# Patient Record
Sex: Male | Born: 2000 | Race: White | Hispanic: No | Marital: Single | Smoking: Current some day smoker
Health system: Southern US, Community
[De-identification: ages and names within clinical notes are randomized; demographics above are authoritative.]

## PROBLEM LIST (undated history)

## (undated) DIAGNOSIS — F329 Major depressive disorder, single episode, unspecified: Secondary | ICD-10-CM

## (undated) DIAGNOSIS — F79 Unspecified intellectual disabilities: Secondary | ICD-10-CM

## (undated) DIAGNOSIS — F32A Depression, unspecified: Secondary | ICD-10-CM

## (undated) DIAGNOSIS — F909 Attention-deficit hyperactivity disorder, unspecified type: Secondary | ICD-10-CM

## (undated) DIAGNOSIS — F4310 Post-traumatic stress disorder, unspecified: Secondary | ICD-10-CM

## (undated) DIAGNOSIS — R51 Headache: Secondary | ICD-10-CM

## (undated) HISTORY — DX: Headache: R51

## (undated) HISTORY — PX: CIRCUMCISION: SHX1350

---
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
## 2019-01-20 ENCOUNTER — Other Ambulatory Visit: Payer: Self-pay

## 2019-01-20 DIAGNOSIS — Z20822 Contact with and (suspected) exposure to covid-19: Secondary | ICD-10-CM

## 2019-01-22 LAB — NOVEL CORONAVIRUS, NAA: SARS-CoV-2, NAA: NOT DETECTED

## 2019-01-23 ENCOUNTER — Telehealth: Payer: Self-pay | Admitting: General Practice

## 2019-01-23 NOTE — Telephone Encounter (Signed)
Negative COVID results given. Patient results "NOT Detected." Caller expressed understanding. ° °

## 2019-05-20 ENCOUNTER — Other Ambulatory Visit: Payer: Self-pay

## 2019-05-20 ENCOUNTER — Encounter: Payer: Self-pay | Admitting: Emergency Medicine

## 2019-05-20 ENCOUNTER — Emergency Department
Admission: EM | Admit: 2019-05-20 | Discharge: 2019-05-20 | Disposition: A | Discharge status: Home / Self Care | Payer: Medicaid Other | Attending: Emergency Medicine | Admitting: Emergency Medicine

## 2019-05-20 ENCOUNTER — Emergency Department: Payer: Medicaid Other

## 2019-05-20 DIAGNOSIS — Z87891 Personal history of nicotine dependence: Secondary | ICD-10-CM | POA: Insufficient documentation

## 2019-05-20 DIAGNOSIS — F909 Attention-deficit hyperactivity disorder, unspecified type: Secondary | ICD-10-CM | POA: Diagnosis not present

## 2019-05-20 DIAGNOSIS — Y939 Activity, unspecified: Secondary | ICD-10-CM | POA: Insufficient documentation

## 2019-05-20 DIAGNOSIS — S29012A Strain of muscle and tendon of back wall of thorax, initial encounter: Secondary | ICD-10-CM | POA: Insufficient documentation

## 2019-05-20 DIAGNOSIS — Z79899 Other long term (current) drug therapy: Secondary | ICD-10-CM | POA: Insufficient documentation

## 2019-05-20 DIAGNOSIS — Y92410 Unspecified street and highway as the place of occurrence of the external cause: Secondary | ICD-10-CM | POA: Insufficient documentation

## 2019-05-20 DIAGNOSIS — Y999 Unspecified external cause status: Secondary | ICD-10-CM | POA: Diagnosis not present

## 2019-05-20 DIAGNOSIS — S299XXA Unspecified injury of thorax, initial encounter: Secondary | ICD-10-CM | POA: Diagnosis present

## 2019-05-20 DIAGNOSIS — T148XXA Other injury of unspecified body region, initial encounter: Secondary | ICD-10-CM

## 2019-05-20 MED ORDER — BACLOFEN 5 MG PO TABS: 5.0000 mg | ORAL_TABLET | Freq: Three times a day (TID) | ORAL | 0 refills | Status: DC | PRN
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
## 2019-08-17 ENCOUNTER — Ambulatory Visit (INDEPENDENT_AMBULATORY_CARE_PROVIDER_SITE_OTHER): Payer: Medicaid Other

## 2019-08-17 ENCOUNTER — Other Ambulatory Visit: Payer: Self-pay

## 2019-08-17 ENCOUNTER — Ambulatory Visit
Admission: EM | Admit: 2019-08-17 | Discharge: 2019-08-17 | Disposition: A | Discharge status: Home / Self Care | Payer: Medicaid Other | Attending: Family Medicine | Admitting: Family Medicine

## 2019-08-17 DIAGNOSIS — M25572 Pain in left ankle and joints of left foot: Secondary | ICD-10-CM

## 2019-08-17 DIAGNOSIS — M25561 Pain in right knee: Secondary | ICD-10-CM

## 2019-08-17 MED ORDER — MELOXICAM 15 MG PO TABS: 15.0000 mg | ORAL_TABLET | Freq: Every day | ORAL | 0 refills | Status: DC | PRN

## 2019-08-17 NOTE — Discharge Instructions (Signed)
No evidence of fracture.  Take medication as prescribed.  Dr. Adriana Simas
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
## 2020-06-12 ENCOUNTER — Ambulatory Visit
Admission: EM | Admit: 2020-06-12 | Discharge: 2020-06-12 | Disposition: A | Discharge status: Home / Self Care | Payer: Medicaid Other

## 2020-06-12 ENCOUNTER — Other Ambulatory Visit: Payer: Self-pay

## 2020-06-12 ENCOUNTER — Encounter: Payer: Self-pay | Admitting: Emergency Medicine

## 2020-06-12 NOTE — ED Triage Notes (Signed)
Patient states that he woke up this morning and had redness and swelling on the right side of his face.  Patient denies itching.  Patient states that took 2 Benadryl tablets today and an allergy medicine.  Patient denies SOB or difficulty swallowing.

## 2020-06-12 NOTE — ED Notes (Signed)
Patient states that he can no longer wait to be seen and needs to leave.

## 2021-06-02 ENCOUNTER — Emergency Department (HOSPITAL_COMMUNITY)
Admission: EM | Admit: 2021-06-02 | Discharge: 2021-06-02 | Disposition: A | Discharge status: Home / Self Care | Payer: Medicaid Other | Attending: Emergency Medicine | Admitting: Emergency Medicine

## 2021-06-02 ENCOUNTER — Encounter (HOSPITAL_COMMUNITY): Payer: Self-pay | Admitting: Emergency Medicine

## 2021-06-02 DIAGNOSIS — T18120A Food in esophagus causing compression of trachea, initial encounter: Secondary | ICD-10-CM | POA: Insufficient documentation

## 2021-06-02 DIAGNOSIS — K222 Esophageal obstruction: Secondary | ICD-10-CM

## 2021-06-02 DIAGNOSIS — X58XXXA Exposure to other specified factors, initial encounter: Secondary | ICD-10-CM | POA: Insufficient documentation

## 2021-06-02 DIAGNOSIS — T189XXA Foreign body of alimentary tract, part unspecified, initial encounter: Secondary | ICD-10-CM | POA: Diagnosis present

## 2021-06-02 DIAGNOSIS — T18128A Food in esophagus causing other injury, initial encounter: Secondary | ICD-10-CM

## 2021-06-02 NOTE — ED Provider Notes (Signed)
?[facility] DEPT ?Provider Note ? ? ?CSN: [id number] ?Arrival date & time: 06/02/21  1812 ? ?  ? ?History ? ?Chief Complaint  ?Patient presents with  ? Swallowed Foreign Body  ? ? ?Ryan Chen is a 21 y.o. male. ? ?Patient is a 21 year old male who presents with difficulty swallowing.  He states he was eating a steak and chicken burrito about an hour prior to arrival and feels like it got stuck in his throat.  He says anytime he tries to swallow it comes right back up.  No shortness of breath.  No history of similar symptoms in the past. ? ? ?  ? ?Home Medications ?Prior to Admission medications   ?Medication Sig Start Date End Date Taking? Authorizing Provider  ?CONCERTA 36 MG CR tablet TK 1 T PO QAM 02/21/18   [provider]  ?escitalopram (LEXAPRO) 10 MG tablet TAKE 1/2 TABLET BY MOUTH DAILY FOR 1 WEEK THEN TAKE 1 TABLET BY MOUTH DAILY 07/20/19   [provider]  ?loratadine (CLARITIN) 10 MG tablet Take 10 mg by mouth daily.    [provider]  ?meloxicam (MOBIC) 15 MG tablet Take 1 tablet (15 mg total) by mouth daily as needed for pain. 08/17/19   Coral Spikes, DO  ?sertraline (ZOLOFT) 50 MG tablet Take 1 tablet by mouth daily. 03/07/17 08/17/19  [provider]  ?traZODone (DESYREL) 50 MG tablet TK 1 T PO QHS 02/21/18 08/17/19  [provider]  ?Alycia Patten 40 MG capsule Take one capsule by mouth every morning 01/07/16 08/17/19  [provider]  ?   ? ?Allergies    ?Soy allergy and Amoxicillin   ? ?Review of Systems   ?Review of Systems  ?Constitutional:  Negative for chills, diaphoresis, fatigue and fever.  ?HENT:  Positive for trouble swallowing. Negative for congestion, rhinorrhea and sneezing.   ?Eyes: Negative.   ?Respiratory:  Negative for cough, chest tightness and shortness of breath.   ?Cardiovascular:  Negative for chest pain and leg swelling.  ?Gastrointestinal:  Negative for abdominal pain, blood in stool, diarrhea,  nausea and vomiting.  ?Genitourinary:  Negative for difficulty urinating, flank pain, frequency and hematuria.  ?Musculoskeletal:  Negative for arthralgias and back pain.  ?Skin:  Negative for rash.  ?Neurological:  Negative for dizziness, speech difficulty, weakness, numbness and headaches.  ? ?Physical Exam ?Updated Vital Signs ?BP (!) 111/56   Pulse (!) 50   Temp 98.6 ?F (37 ?C) (Oral)   Resp 11   Ht 6\' 1"  (1.854 m)   Wt 113.4 kg   SpO2 98%   BMI 32.98 kg/m?  ?Physical Exam ?Constitutional:   ?   Appearance: He is well-developed.  ?HENT:  ?   Head: Normocephalic and atraumatic.  ?Eyes:  ?   Pupils: Pupils are equal, round, and reactive to light.  ?Cardiovascular:  ?   Rate and Rhythm: Normal rate and regular rhythm.  ?   Heart sounds: Normal heart sounds.  ?Pulmonary:  ?   Effort: Pulmonary effort is normal. No respiratory distress.  ?   Breath sounds: Normal breath sounds. No wheezing or rales.  ?Chest:  ?   Chest wall: No tenderness.  ?Abdominal:  ?   General: Bowel sounds are normal.  ?   Palpations: Abdomen is soft.  ?   Tenderness: There is no abdominal tenderness. There is no guarding or rebound.  ?Musculoskeletal:     ?   General: Normal range of motion.  ?  Cervical back: Normal range of motion and neck supple.  ?Lymphadenopathy:  ?   Cervical: No cervical adenopathy.  ?Skin: ?   General: Skin is warm and dry.  ?   Findings: No rash.  ?Neurological:  ?   Mental Status: He is alert and oriented to person, place, and time.  ? ? ?ED Results / Procedures / Treatments   ?Labs ?(all labs ordered are listed, but only abnormal results are displayed) ?Labs Reviewed - No data to display ? ?EKG ?None ? ?Radiology ?No results found. ? ?Procedures ?Procedures  ? ? ?Medications Ordered in ED ?Medications - No data to display ? ?ED Course/ Medical Decision Making/ A&P ?  ?                        ?Medical Decision Making ? ?Patient is a 21 year old male who presents with a possible esophageal foreign body.  He  said he was unable to swallow anything without it coming right back up.  After examining the patient, I gave him some water to sip and he actually felt like it went down.  He was able to swallow the water without difficulty.  He then had a bigger p.o. trial which he handled without difficulty.  He is able to eat and drink without difficulty.  He denies any foreign body sensation.  He was discharged home in good condition.  He has a follow-up appointment with his PCP on April 1.  I also gave him referral to gastroenterology if he has any ongoing symptoms.  Return precautions were given. ? ?Final Clinical Impression(s) / ED Diagnoses ?Final diagnoses:  ?Esophageal obstruction due to food impaction  ? ? ?Rx / DC Orders ?ED Discharge Orders   ? ? None  ? ?  ? ? ?  ?Malvin Johns, MD ?06/02/21 2059 ? ?

## 2021-06-02 NOTE — ED Triage Notes (Signed)
Patient reports eating burrito x1 hour ago and it is stuck in his throat. Unable to tolerate PO. ?

## 2021-06-02 NOTE — ED Notes (Signed)
Patient verbalizes understanding of discharge instructions. Opportunity for questioning and answers were provided. Armband removed by staff, pt discharged from ED. Ambulated out to lobby  

## 2021-06-02 NOTE — Discharge Instructions (Signed)
Eat soft food for the next few days and chew your food really well.  Follow-up with your primary care doctor or gastroenterologist listed above if you have any ongoing symptoms.  Return to emergency room if you have any worsening symptoms. ?

## 2021-12-07 ENCOUNTER — Ambulatory Visit: Payer: Self-pay

## 2022-06-11 ENCOUNTER — Other Ambulatory Visit: Payer: Self-pay

## 2022-06-11 ENCOUNTER — Emergency Department
Admission: EM | Admit: 2022-06-11 | Discharge: 2022-06-11 | Disposition: A | Discharge status: Home / Self Care | Payer: No Typology Code available for payment source | Attending: Emergency Medicine | Admitting: Emergency Medicine

## 2022-06-11 ENCOUNTER — Emergency Department: Payer: No Typology Code available for payment source

## 2022-06-11 DIAGNOSIS — F1193 Opioid use, unspecified with withdrawal: Secondary | ICD-10-CM | POA: Diagnosis not present

## 2022-06-11 DIAGNOSIS — F1490 Cocaine use, unspecified, uncomplicated: Secondary | ICD-10-CM | POA: Diagnosis not present

## 2022-06-11 DIAGNOSIS — F1210 Cannabis abuse, uncomplicated: Secondary | ICD-10-CM | POA: Diagnosis not present

## 2022-06-11 DIAGNOSIS — F1120 Opioid dependence, uncomplicated: Secondary | ICD-10-CM

## 2022-06-11 LAB — URINE DRUG SCREEN, QUALITATIVE (ARMC ONLY)
Amphetamines, Ur Screen: NOT DETECTED
Barbiturates, Ur Screen: NOT DETECTED
Benzodiazepine, Ur Scrn: NOT DETECTED
Cannabinoid 50 Ng, Ur ~~LOC~~: POSITIVE — AB
Cocaine Metabolite,Ur ~~LOC~~: POSITIVE — AB
MDMA (Ecstasy)Ur Screen: NOT DETECTED
Methadone Scn, Ur: NOT DETECTED
Opiate, Ur Screen: POSITIVE — AB
Phencyclidine (PCP) Ur S: NOT DETECTED
Tricyclic, Ur Screen: NOT DETECTED

## 2022-06-11 LAB — CBC
HCT: 44 % (ref 39.0–52.0)
Hemoglobin: 14.9 g/dL (ref 13.0–17.0)
MCH: 29.9 pg (ref 26.0–34.0)
MCHC: 33.9 g/dL (ref 30.0–36.0)
MCV: 88.4 fL (ref 80.0–100.0)
Platelets: 234 10*3/uL (ref 150–400)
RBC: 4.98 MIL/uL (ref 4.22–5.81)
RDW: 12.4 % (ref 11.5–15.5)
WBC: 10.4 10*3/uL (ref 4.0–10.5)
nRBC: 0 % (ref 0.0–0.2)

## 2022-06-11 LAB — COMPREHENSIVE METABOLIC PANEL
ALT: 21 U/L (ref 0–44)
AST: 22 U/L (ref 15–41)
Albumin: 4.6 g/dL (ref 3.5–5.0)
Alkaline Phosphatase: 57 U/L (ref 38–126)
Anion gap: 10 (ref 5–15)
BUN: 19 mg/dL (ref 6–20)
CO2: 22 mmol/L (ref 22–32)
Calcium: 9.8 mg/dL (ref 8.9–10.3)
Chloride: 108 mmol/L (ref 98–111)
Creatinine, Ser: 0.83 mg/dL (ref 0.61–1.24)
GFR, Estimated: >60 mL/min (ref >60)
Glucose, Bld: 111 mg/dL — ABNORMAL HIGH (ref 70–99)
Potassium: 3.7 mmol/L (ref 3.5–5.1)
Sodium: 140 mmol/L (ref 135–145)
Total Bilirubin: 0.8 mg/dL (ref 0.3–1.2)
Total Protein: 7.3 g/dL (ref 6.5–8.1)

## 2022-06-11 LAB — ETHANOL: Alcohol, Ethyl (B): <10 mg/dL (ref <10)

## 2022-06-11 MED ORDER — BUPRENORPHINE HCL-NALOXONE HCL 2-0.5 MG SL SUBL
2.0000 | SUBLINGUAL_TABLET | Freq: Once | SUBLINGUAL | Status: AC
  Administered 2022-06-11: 2 via SUBLINGUAL
  Filled 2022-06-11: qty 2

## 2022-06-11 MED ORDER — NALOXONE HCL 4 MG/0.1ML NA LIQD: NASAL | 0 refills | Status: AC

## 2022-06-11 NOTE — ED Triage Notes (Addendum)
Pt to ED via POV from home. Pt reports withdraw from heroin. Pt is trying to self-detox and is wanting to get on subaxone. Pt denies [place]. Pt reports mild nausea, generalized body aches and tremors. Pt states last used heroin was yesterday at 3pm.

## 2022-06-11 NOTE — ED Notes (Signed)
See triage note. Pt reports withdrawing from heroin, last use yesterday morning. Reports arms and legs hurting and some nausea. Pt repeatedly sitting up and down, appears restless.

## 2022-06-11 NOTE — Discharge Instructions (Addendum)
You are seen in the emergency department and you had a urine that was positive for cocaine, opioids and marijuana.  It is important that you get help with your addiction issues.  You had a CT scan of your head that was normal.  Do not believe that you are having a seizure disorder but believe that you are episodes are from substance abuse.

## 2022-06-11 NOTE — ED Notes (Signed)
Pharmacy contacted for suboxone

## 2022-06-11 NOTE — ED Provider Notes (Addendum)
[facility] Provider Note    Event Date/Time   First MD Initiated Contact with Patient 06/11/22 1453     (approximate)   History   Withdrawal   HPI  Ryan Chen is a 22 y.o. male past medical history significant for heroin use, who presents to the emergency department with concern for withdrawal.  Patient states that he has not been feeling well and withdrawing from heroin, last use heroin yesterday.  Feeling very restless, nauseous and sweating.  States that he has not been on Subutex a long time in the past approximately 2 years ago.  States that his mother is currently calling around to find a place for him for rehab and Suboxone.  Mother called nursing staff and stated that he has had multiple episodes where she was concerned he may have a seizure.  Did not have a postictal period.  States that he will lay on the ground, no generalized shaking activity, no urinary or tongue biting.  Immediately returned back to his normal.     Physical Exam   Triage Vital Signs: ED Triage Vitals  Enc Vitals Group     BP 06/11/22 1409 129/81     Pulse Rate 06/11/22 1409 (!) 103     Resp 06/11/22 1409 18     Temp 06/11/22 1416 98.1 F (36.7 C)     Temp Source 06/11/22 1409 Oral     SpO2 06/11/22 1409 95 %     Weight --      Height --      Head Circumference --      Peak Flow --      Pain Score 06/11/22 1413 8     Pain Loc --      Pain Edu? --      Excl. in [place]? --     Most recent vital signs: Vitals:   06/11/22 1409 06/11/22 1416  BP: 129/81   Pulse: (!) 103   Resp: 18   Temp:  98.1 F (36.7 C)  SpO2: 95%     Physical Exam Constitutional:      Appearance: He is well-developed.     Comments: Restless and trying to walk the hall by the stretcher  HENT:     Head: Atraumatic.  Eyes:     Conjunctiva/sclera: Conjunctivae normal.  Cardiovascular:     Rate and Rhythm: Regular rhythm.  Pulmonary:     Effort: No respiratory distress.   Musculoskeletal:     Cervical back: Normal range of motion.  Skin:    General: Skin is warm.  Neurological:     Mental Status: He is alert. Mental status is at baseline.      IMPRESSION / MDM / ASSESSMENT AND PLAN / ED COURSE  I reviewed the triage vital signs and the nursing notes.  Differential diagnosis including electrolyte abnormality, intracranial hemorrhage, heroin withdraw  Picture consistent with heroin withdrawal.  Will give a dose of Suboxone 4 mg in the emergency department.  Discussed with the patient that we will not do a prescription and that he will need to follow-up as an outpatient.  Given information for [facility] services and with psychiatry.  Given mother's questionable concern for seizure-like activity does not appear to sound like a seizure given he has no postictal period.  Will do a CT scan of his head to evaluate for intracranial hemorrhage.    Labs (all labs ordered are listed, but only abnormal results are displayed) Labs interpreted as -  Labs Reviewed  COMPREHENSIVE METABOLIC PANEL - Abnormal; Notable for the following components:      Result Value   Glucose, Bld 111 (*)    All other components within normal limits  URINE DRUG SCREEN, QUALITATIVE ([facility] ONLY) - Abnormal; Notable for the following components:   Cocaine Metabolite,Ur [place] POSITIVE (*)    Opiate, Ur Screen POSITIVE (*)    Cannabinoid 50 Ng, Ur [place] POSITIVE (*)    All other components within normal limits  ETHANOL  CBC   No significant electrolyte abnormalities.  CT scan of the head without signs of intracranial hemorrhage and EKG is reassuring will discharge home with prescription for Narcan.  Have a low suspicion for seizure disorder.  Clinical picture is does not sound like a dysrhythmia.  No syncopal episodes.  UDS positive for cocaine, opioids and marijuana.  Given a prescription for Narcan.  Given outpatient resources.      PROCEDURES:  Critical Care performed:  No  Procedures  Patient's presentation is most consistent with acute presentation with potential threat to life or bodily function.   MEDICATIONS ORDERED IN ED: Medications  buprenorphine-naloxone (SUBOXONE) 2-0.5 mg per SL tablet 2 tablet (has no administration in time range)    FINAL CLINICAL IMPRESSION(S) / ED DIAGNOSES   Final diagnoses:  Heroin use disorder, moderate  Heroin withdrawal  Cocaine use  Marijuana abuse     Rx / DC Orders   ED Discharge Orders          Ordered    naloxone (NARCAN) nasal spray 4 mg/0.1 mL        06/11/22 1511             Note:  This document was prepared using Dragon voice recognition software and may include unintentional dictation errors.   Nathaniel Man, MD 06/11/22 1510    Nathaniel Man, MD 06/11/22 [phone]

## 2022-06-11 NOTE — ED Notes (Signed)
Pt getting up walking around, stating "I can't get comfortable"

## 2022-08-02 ENCOUNTER — Emergency Department
Admission: EM | Admit: 2022-08-02 | Discharge: 2022-08-02 | Disposition: A | Discharge status: Home / Self Care | Payer: Medicaid Other | Attending: Emergency Medicine | Admitting: Emergency Medicine

## 2022-08-02 ENCOUNTER — Other Ambulatory Visit: Payer: Self-pay

## 2022-08-02 DIAGNOSIS — T402X1A Poisoning by other opioids, accidental (unintentional), initial encounter: Secondary | ICD-10-CM | POA: Insufficient documentation

## 2022-08-02 LAB — COMPREHENSIVE METABOLIC PANEL
ALT: 17 U/L (ref 0–44)
AST: 19 U/L (ref 15–41)
Albumin: 4 g/dL (ref 3.5–5.0)
Alkaline Phosphatase: 42 U/L (ref 38–126)
Anion gap: 10 (ref 5–15)
BUN: 25 mg/dL — ABNORMAL HIGH (ref 6–20)
CO2: 19 mmol/L — ABNORMAL LOW (ref 22–32)
Calcium: 8.6 mg/dL — ABNORMAL LOW (ref 8.9–10.3)
Chloride: 109 mmol/L (ref 98–111)
Creatinine, Ser: 0.94 mg/dL (ref 0.61–1.24)
GFR, Estimated: >60 mL/min (ref >60)
Glucose, Bld: 138 mg/dL — ABNORMAL HIGH (ref 70–99)
Potassium: 3.4 mmol/L — ABNORMAL LOW (ref 3.5–5.1)
Sodium: 138 mmol/L (ref 135–145)
Total Bilirubin: 0.5 mg/dL (ref 0.3–1.2)
Total Protein: 6.1 g/dL — ABNORMAL LOW (ref 6.5–8.1)

## 2022-08-02 LAB — CBC WITH DIFFERENTIAL/PLATELET
Abs Immature Granulocytes: 0.02 10*3/uL (ref 0.00–0.07)
Basophils Absolute: 0 10*3/uL (ref 0.0–0.1)
Basophils Relative: 1 %
Eosinophils Absolute: 1.2 10*3/uL — ABNORMAL HIGH (ref 0.0–0.5)
Eosinophils Relative: 14 %
HCT: 42.2 % (ref 39.0–52.0)
Hemoglobin: 14 g/dL (ref 13.0–17.0)
Immature Granulocytes: 0 %
Lymphocytes Relative: 38 %
Lymphs Abs: 3.3 10*3/uL (ref 0.7–4.0)
MCH: 30.1 pg (ref 26.0–34.0)
MCHC: 33.2 g/dL (ref 30.0–36.0)
MCV: 90.8 fL (ref 80.0–100.0)
Monocytes Absolute: 0.5 10*3/uL (ref 0.1–1.0)
Monocytes Relative: 6 %
Neutro Abs: 3.6 10*3/uL (ref 1.7–7.7)
Neutrophils Relative %: 41 %
Platelets: 255 10*3/uL (ref 150–400)
RBC: 4.65 MIL/uL (ref 4.22–5.81)
RDW: 13.6 % (ref 11.5–15.5)
WBC: 8.7 10*3/uL (ref 4.0–10.5)
nRBC: 0 % (ref 0.0–0.2)

## 2022-08-02 LAB — URINE DRUG SCREEN, QUALITATIVE (ARMC ONLY)
Amphetamines, Ur Screen: NOT DETECTED
Barbiturates, Ur Screen: NOT DETECTED
Benzodiazepine, Ur Scrn: NOT DETECTED
Cannabinoid 50 Ng, Ur ~~LOC~~: POSITIVE — AB
Cocaine Metabolite,Ur ~~LOC~~: POSITIVE — AB
MDMA (Ecstasy)Ur Screen: NOT DETECTED
Methadone Scn, Ur: POSITIVE — AB
Opiate, Ur Screen: POSITIVE — AB
Phencyclidine (PCP) Ur S: NOT DETECTED
Tricyclic, Ur Screen: POSITIVE — AB

## 2022-08-02 LAB — ETHANOL: Alcohol, Ethyl (B): <10 mg/dL (ref <10)

## 2022-08-02 LAB — SALICYLATE LEVEL: Salicylate Lvl: <7 mg/dL — ABNORMAL LOW (ref 7.0–30.0)

## 2022-08-02 LAB — ACETAMINOPHEN LEVEL: Acetaminophen (Tylenol), Serum: <10 ug/mL — ABNORMAL LOW (ref 10–30)

## 2022-08-02 MED ORDER — NALOXONE HCL 4 MG/0.1ML NA LIQD
1.0000 | Freq: Once | NASAL | Status: AC
  Administered 2022-08-02: 1 via NASAL
  Filled 2022-08-02: qty 4

## 2022-08-02 MED ORDER — LACTATED RINGERS IV BOLUS
1000.0000 mL | Freq: Once | INTRAVENOUS | Status: AC
  Administered 2022-08-02: 1000 mL via INTRAVENOUS

## 2022-08-02 NOTE — ED Provider Notes (Signed)
[facility] Provider Note    Event Date/Time   First MD Initiated Contact with Patient 08/02/22 0024     (approximate)   History   Drug Overdose   HPI  Ryan Chen is a 22 y.o. male past medical history of opiate use disorder currently on methadone, ADHD, PTSD, bipolar disorder who presents because of an accidental overdose.  Patient is currently on methadone 20 mg daily.  Has been in remission from his opiate use disorder since February.  Tonight was offered a half of oxycodone pill and took it.  Bystanders then called EMS because patient was altered.  When EMS arrived he was going in and out of consciousness and received 1 mg IV Narcan with improvement in mental status.  Patient also endorses taking 3 Benadryl pills he is not sure of the dose to try to sleep.  He denies any intent to harm himself says he has been out of his trazodone just wanted to sleep.  Does say that he would not typically take 3 pills at once.  Denies any other drug or alcohol use.  Denies any intent to harm himself no SI or depression.     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      Excl. in [place]?     Most recent vital signs: Vitals:   08/02/22 0130 08/02/22 0200  BP: 122/69 117/67  Pulse: 97 92  Resp: 15 17  Temp:    SpO2: 100% 98%     General: Awake, no distress.  CV:  Good peripheral perfusion tachycardic Resp:  Normal effort.  Abd:  No distention.  Neuro:             Awake, Alert, Oriented x 3  Other:  Calm cooperative, no SI   ED Results / Procedures / Treatments  Labs (all labs ordered are listed, but only abnormal results are displayed) Labs Reviewed  COMPREHENSIVE METABOLIC PANEL - Abnormal; Notable for the following components:      Result Value   Potassium 3.4 (*)    CO2 19 (*)    Glucose, Bld 138 (*)    BUN 25 (*)    Calcium 8.6 (*)    Total Protein 6.1 (*)    All other components within normal limits  CBC WITH DIFFERENTIAL/PLATELET -  Abnormal; Notable for the following components:   Eosinophils Absolute 1.2 (*)    All other components within normal limits  SALICYLATE LEVEL - Abnormal; Notable for the following components:   Salicylate Lvl <7.0 (*)    All other components within normal limits  ACETAMINOPHEN LEVEL - Abnormal; Notable for the following components:   Acetaminophen (Tylenol), Serum <10 (*)    All other components within normal limits  URINE DRUG SCREEN, QUALITATIVE ([facility] ONLY) - Abnormal; Notable for the following components:   Tricyclic, Ur Screen POSITIVE (*)    Cocaine Metabolite,Ur [place] POSITIVE (*)    Opiate, Ur Screen POSITIVE (*)    Cannabinoid 50 Ng, Ur [place] POSITIVE (*)    Methadone Scn, Ur POSITIVE (*)    All other components within normal limits  ETHANOL     EKG  EKG interpretation performed by myself: sinus tach, nml axis, nml intervals, no acute ischemic changes    RADIOLOGY    PROCEDURES:  Critical Care performed: No  .1-3 Lead EKG Interpretation  Performed by: Georga Hacking, MD Authorized by: Georga Hacking, MD      Interpretation: abnormal     ECG rate assessment: tachycardic     Rhythm: sinus tachycardia     Ectopy: none     Conduction: normal     The patient is on the cardiac monitor to evaluate for evidence of arrhythmia and/or significant heart rate changes.   MEDICATIONS ORDERED IN ED: Medications  naloxone (NARCAN) nasal spray 4 mg/0.1 mL (has no administration in time range)  lactated ringers bolus 1,000 mL (0 mLs Intravenous Stopped 08/02/22 0157)     IMPRESSION / MDM / ASSESSMENT AND PLAN / ED COURSE  I reviewed the triage vital signs and the nursing notes.                              Patient's presentation is most consistent with acute presentation with potential threat to life or bodily function.  Differential diagnosis includes, but is not limited to, accidental opiate overdose, anticholinergic overdose, opiate withdrawal  Patient is a 22 year old male with history of opiate use disorder currently on methadone who presents after an accidental opiate overdose.  He was given half a pill of oxycodone which she thinks was laced with fentanyl in hindsight.  He was found to be altered by family and when EMS arrived he was going in and out of consciousness received 1 mg of IV Narcan with improvement in mental status.  Does admit to taking 3 Benadryl pills to help him sleep this evening.  Denies any intent to harm himself denies any increasing depression but is out of his trazodone to use the Benadryl as replacement.  Patient is tachycardic on arrival but is saturating normal.  He is mentating normally with good respirations.  He has no complaints at this time.  He is not expressing any SI.  Plan to give a fluid bolus given his tachycardia.  He denies any symptoms of withdrawal currently but will monitor closely for this given he is on methadone chronically.  Will check Tylenol and salicylate level CBC CMP and ethanol given heavy Benadryl use tonight but this does not appear to have been a  suicide attempt do not feel need to IVC or involve psychiatry at this time.  Tylenol and salicylate on level are negative.  Patient's tachycardia improved.  UDS positive for multiple substances.  On reassessment he is  mentating normally with normal respirations and O2 sat.  Observed for about 2 and half hours in the ED.  This point I do think he is appropriate for discharge.  Will discharge with Narcan in hand.       FINAL CLINICAL IMPRESSION(S) / ED DIAGNOSES   Final diagnoses:  Opioid overdose, accidental or unintentional, initial encounter [facility])     Rx / DC Orders   ED Discharge Orders     None        Note:  This document was prepared using Dragon voice recognition software and may include unintentional dictation errors.   Georga Hacking, MD 08/02/22 [phone]

## 2022-08-02 NOTE — ED Notes (Signed)
Discharged with home narcan nasal spray. Pt verbalizes understanding and demonstrates proper administration.   Verbalizes discharge teaching. Has a ride home present.   Ambulatory out of department.

## 2022-08-02 NOTE — ED Notes (Signed)
Attempting to provide urine sample.

## 2022-08-02 NOTE — ED Triage Notes (Addendum)
Arrives [facility] from home after suspected drug overdose. Has hx of heroin use but sts last use was Feb. 2024. Sees [facility], and goes to methadone clinic daily.   Tonight took a suspected 0.5 tab of oxycodone but thought it looked strange also admits to taking (3) benadryl as a sleep aid. Reported afterwards pt was going "in-and-out" of it. Mother called EMS for transport to hospital. Denies intent to self  harm.   4mg  zofran 1mg  narcan admin by paramedics.  18g L-fa

## 2022-09-13 ENCOUNTER — Emergency Department
Admission: EM | Admit: 2022-09-13 | Discharge: 2022-09-17 | Disposition: A | Discharge status: Home / Self Care | Payer: MEDICAID | Attending: Student in an Organized Health Care Education/Training Program | Admitting: Student in an Organized Health Care Education/Training Program

## 2022-09-13 ENCOUNTER — Other Ambulatory Visit: Payer: Self-pay

## 2022-09-13 DIAGNOSIS — F79 Unspecified intellectual disabilities: Secondary | ICD-10-CM

## 2022-09-13 DIAGNOSIS — F1910 Other psychoactive substance abuse, uncomplicated: Secondary | ICD-10-CM | POA: Insufficient documentation

## 2022-09-13 DIAGNOSIS — R451 Restlessness and agitation: Secondary | ICD-10-CM

## 2022-09-13 DIAGNOSIS — F22 Delusional disorders: Secondary | ICD-10-CM | POA: Diagnosis not present

## 2022-09-13 DIAGNOSIS — F1914 Other psychoactive substance abuse with psychoactive substance-induced mood disorder: Secondary | ICD-10-CM | POA: Diagnosis not present

## 2022-09-13 DIAGNOSIS — Z79899 Other long term (current) drug therapy: Secondary | ICD-10-CM | POA: Insufficient documentation

## 2022-09-13 DIAGNOSIS — F1994 Other psychoactive substance use, unspecified with psychoactive substance-induced mood disorder: Secondary | ICD-10-CM | POA: Insufficient documentation

## 2022-09-13 DIAGNOSIS — F909 Attention-deficit hyperactivity disorder, unspecified type: Secondary | ICD-10-CM | POA: Diagnosis present

## 2022-09-13 LAB — CBC
HCT: 44.6 % (ref 39.0–52.0)
Hemoglobin: 14.8 g/dL (ref 13.0–17.0)
MCH: 29.7 pg (ref 26.0–34.0)
MCHC: 33.2 g/dL (ref 30.0–36.0)
MCV: 89.6 fL (ref 80.0–100.0)
Platelets: 233 10*3/uL (ref 150–400)
RBC: 4.98 MIL/uL (ref 4.22–5.81)
RDW: 13.2 % (ref 11.5–15.5)
WBC: 10.9 10*3/uL — ABNORMAL HIGH (ref 4.0–10.5)
nRBC: 0 % (ref 0.0–0.2)

## 2022-09-13 LAB — COMPREHENSIVE METABOLIC PANEL
ALT: 22 U/L (ref 0–44)
AST: 22 U/L (ref 15–41)
Albumin: 4.7 g/dL (ref 3.5–5.0)
Alkaline Phosphatase: 56 U/L (ref 38–126)
Anion gap: 9 (ref 5–15)
BUN: 22 mg/dL — ABNORMAL HIGH (ref 6–20)
CO2: 21 mmol/L — ABNORMAL LOW (ref 22–32)
Calcium: 9.3 mg/dL (ref 8.9–10.3)
Chloride: 107 mmol/L (ref 98–111)
Creatinine, Ser: 0.82 mg/dL (ref 0.61–1.24)
GFR, Estimated: >60 mL/min (ref >60)
Glucose, Bld: 108 mg/dL — ABNORMAL HIGH (ref 70–99)
Potassium: 3.3 mmol/L — ABNORMAL LOW (ref 3.5–5.1)
Sodium: 137 mmol/L (ref 135–145)
Total Bilirubin: 0.9 mg/dL (ref 0.3–1.2)
Total Protein: 7.4 g/dL (ref 6.5–8.1)

## 2022-09-13 LAB — URINE DRUG SCREEN, QUALITATIVE (ARMC ONLY)
Amphetamines, Ur Screen: NOT DETECTED
Barbiturates, Ur Screen: NOT DETECTED
Benzodiazepine, Ur Scrn: NOT DETECTED
Cannabinoid 50 Ng, Ur ~~LOC~~: POSITIVE — AB
Cocaine Metabolite,Ur ~~LOC~~: POSITIVE — AB
MDMA (Ecstasy)Ur Screen: NOT DETECTED
Methadone Scn, Ur: POSITIVE — AB
Opiate, Ur Screen: POSITIVE — AB
Phencyclidine (PCP) Ur S: NOT DETECTED
Tricyclic, Ur Screen: NOT DETECTED

## 2022-09-13 LAB — ACETAMINOPHEN LEVEL: Acetaminophen (Tylenol), Serum: <10 ug/mL — ABNORMAL LOW (ref 10–30)

## 2022-09-13 LAB — ETHANOL: Alcohol, Ethyl (B): <10 mg/dL (ref <10)

## 2022-09-13 LAB — SALICYLATE LEVEL: Salicylate Lvl: <7 mg/dL — ABNORMAL LOW (ref 7.0–30.0)

## 2022-09-13 NOTE — ED Notes (Signed)
Pt dressed out with this RN, [place] NT. Belongings include: Wallace Cullens shoes White socks [employer] Green shorts [employer] boxers

## 2022-09-13 NOTE — ED Notes (Signed)
Provided evening snack 

## 2022-09-13 NOTE — ED Provider Notes (Signed)
[facility] Provider Note    Event Date/Time   First MD Initiated Contact with Patient 09/13/22 1007     (approximate)   History   Psychiatric Evaluation   HPI  Tejuan Curler is a 22 y.o. male with a reported history of depression ADHD and impulsiveness presents to the ER due to increasing agitation and get an argument with his mother.  Reportedly missed his Abilify injection this past month.  Feeling increasingly paranoid that someone is going to hurt him because he says that he has money.  Denies SI or HI     Physical Exam   Triage Vital Signs: ED Triage Vitals  Enc Vitals Group     BP 09/13/22 0902 (!) 168/104     Pulse Rate 09/13/22 0902 (!) 125     Resp 09/13/22 0902 18     Temp 09/13/22 0902 98.8 F (37.1 C)     Temp src --      SpO2 09/13/22 0902 97 %     Weight 09/13/22 0903 250 lb (113.4 kg)     Height 09/13/22 0903 6' (1.829 m)     Head Circumference --      Peak Flow --      Pain Score 09/13/22 0902 0     Pain Loc --      Pain Edu? --      Excl. in [place]? --     Most recent vital signs: Vitals:   09/13/22 0902  BP: (!) 168/104  Pulse: (!) 125  Resp: 18  Temp: 98.8 F (37.1 C)  SpO2: 97%     Constitutional: Alert  Eyes: Conjunctivae are normal.  Head: Atraumatic. Nose: No congestion/rhinnorhea. Mouth/Throat: Mucous membranes are moist.   Neck: Painless ROM.  Cardiovascular:   Good peripheral circulation. Respiratory: Normal respiratory effort.  No retractions.  Gastrointestinal: Soft and nontender.  Musculoskeletal:  no deformity Neurologic:  MAE spontaneously. No gross focal neurologic deficits are appreciated.  Skin:  Skin is warm, dry and intact. No rash noted. Psychiatric: Calm and cooperative    ED Results / Procedures / Treatments   Labs (all labs ordered are listed, but only abnormal results are displayed) Labs Reviewed  COMPREHENSIVE METABOLIC PANEL - Abnormal; Notable for the following components:       Result Value   Potassium 3.3 (*)    CO2 21 (*)    Glucose, Bld 108 (*)    BUN 22 (*)    All other components within normal limits  CBC - Abnormal; Notable for the following components:   WBC 10.9 (*)    All other components within normal limits  ACETAMINOPHEN LEVEL - Abnormal; Notable for the following components:   Acetaminophen (Tylenol), Serum <10 (*)    All other components within normal limits  SALICYLATE LEVEL - Abnormal; Notable for the following components:   Salicylate Lvl <7.0 (*)    All other components within normal limits  ETHANOL  URINE DRUG SCREEN, QUALITATIVE ([facility] ONLY)     EKG     RADIOLOGY    PROCEDURES:  Critical Care performed:   Procedures   MEDICATIONS ORDERED IN ED: Medications - No data to display   IMPRESSION / MDM / ASSESSMENT AND PLAN / ED COURSE  I reviewed the triage vital signs and the nursing notes.  Differential diagnosis includes, but is not limited to, Psychosis, delirium, medication effect, noncompliance, polysubstance abuse, Si, Hi, depression  Patient here for evaluation of agititation.  Patient has psych history of depression, adhd, impulsiveness.  Laboratory testing was ordered to evaluation for underlying electrolyte derangement or signs of underlying organic pathology to explain today's presentation.  Based on history and physical and laboratory evaluation, it appears that the patient's presentation is 2/2 underlying psychiatric disorder and will require further evaluation and management by inpatient psychiatry.  Patient currently agrees to stay voluntary.        FINAL CLINICAL IMPRESSION(S) / ED DIAGNOSES   Final diagnoses:  Agitation     Rx / DC Orders   ED Discharge Orders     None        Note:  This document was prepared using Dragon voice recognition software and may include unintentional dictation errors.    Willy Eddy, MD 09/13/22 [phone]

## 2022-09-13 NOTE — BH Assessment (Signed)
Comprehensive Clinical Assessment (CCA) Note  09/13/2022 [facility] [id number]  Chief Complaint:  Chief Complaint  Patient presents with   Psychiatric Evaluation   Visit Diagnosis: Substance Induced Mood Disorder   Ryan Chen is a 22 year old male who presents to the ER, following an argument he had with his mother. Per the patient, he told his mother someone was going hurt him if he doesn't pay them back. He further explained, he borrowed some tools from them approximately a month ago and need to pay for them. That is what caused the argument.  Per the patient's mother ([phone]), the patient hasn't had his Abilify injection. When this happens, he becomes aggressive and impulsive, how he is currently. She states, he relapses on drugs and start to use again. Today, he told his mother he needed money to pay someone back, but was unable to get into his account. He already spent approximately a thousand dollars this week on drugs. After he was unable to get into his account, he started hitting things and yelling.  It led to him leaving the house and starting running up and down the street yelling. She further explains, he currently receives outpatient treatment with [facility] for his substance use. They have arranged for him to go to a residential treatment program but waiting on a bed to become available. He receives his Abilify injection through [facility], but hasn't followed up with his appointment. 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  During the interview, the patient was calm, cooperative and pleasant. He was able to provide appropriate answers to the questions. He denies SI/HI and AV/H.  CCA Screening, Triage and Referral (STR)  Patient Reported Information How did you hear about [place]? Family/Friend  What Is the Reason for Your Visit/Call Today? Patient state his mother wanted him to come to the ER, following an argument they had.  How Long Has This Been  Causing You Problems? 1 wk - 1 month  What Do You Feel Would Help You the Most Today? Alcohol or Drug Use Treatment   Have You Recently Had Any Thoughts About Hurting Yourself? No  Are You Planning to Commit Suicide/Harm Yourself At This time? No   Flowsheet Row ED from 09/13/2022 in [facility] Emergency Department at [facility] ED from 08/02/2022 in [facility] Emergency Department at [facility] ED from 06/11/2022 in [facility] Emergency Department at [facility]  C-SSRS RISK CATEGORY No Risk No Risk No Risk       Have you Recently Had Thoughts About Hurting Someone Karolee Ohs? No  Are You Planning to Harm Someone at This Time? No  Explanation: No data recorded  Have You Used Any Alcohol or Drugs in the Past 24 Hours? No  What Did You Use and How Much? Cocaine 09/12/2022   Do You Currently Have a Therapist/Psychiatrist? Yes  Name of Therapist/Psychiatrist: Name of Therapist/Psychiatrist: [facility] Ginette Otto)   Have You Been Recently Discharged From Any Public relations account executive or Programs? No  Explanation of Discharge From Practice/Program: No data recorded    CCA Screening Triage Referral Assessment Type of Contact: Face-to-Face  Telemedicine Service Delivery:   Is this Initial or Reassessment?   Date Telepsych consult ordered in [facility]:    Time Telepsych consult ordered in [facility]:    Location of Assessment: [facility] ED  Provider Location: [facility] ED   Collateral Involvement: No data recorded  Does Patient Have a Court Appointed Legal Guardian? No  Legal Guardian  Contact Information: No data recorded Copy of Legal Guardianship Form: No data recorded Legal Guardian Notified of Arrival: No data recorded Legal Guardian Notified of Pending Discharge: No data recorded If Minor and Not Living with Parent(s), Who has Custody? No data recorded Is CPS involved or ever been involved? Never  Is APS involved or ever been involved? Never   Patient Determined To Be At Risk for  Harm To Self or Others Based on Review of Patient Reported Information or Presenting Complaint? No  Method: No data recorded Availability of Means: No data recorded Intent: No data recorded Notification Required: No data recorded Additional Information for Danger to Others Potential: No data recorded Additional Comments for Danger to Others Potential: No data recorded Are There Guns or Other Weapons in Your Home? No data recorded Types of Guns/Weapons: No data recorded Are These Weapons Safely Secured?                            No data recorded Who Could Verify You Are Able To Have These Secured: No data recorded Do You Have any Outstanding Charges, Pending Court Dates, Parole/Probation? No data recorded Contacted To Inform of Risk of Harm To Self or Others: No data recorded   Does Patient Present under Involuntary Commitment? No    [place] of Residence: [place]   Patient Currently Receiving the Following Services: Group Home ([facility])   Determination of Need: Emergent (2 hours)   Options For Referral: ED Referral     CCA Biopsychosocial Patient Reported Schizophrenia/Schizoaffective Diagnosis in Past: No   Strengths: Some insight, stable housing and have a support system.   Mental Health Symptoms Depression:   Difficulty Concentrating; Irritability   Duration of Depressive symptoms:  Duration of Depressive Symptoms: Greater than two weeks   Mania:   Racing thoughts; Irritability   Anxiety:    Difficulty concentrating; Restlessness; Tension; Worrying   Psychosis:   None   Duration of Psychotic symptoms:    Trauma:   N/A   Obsessions:   N/A   Compulsions:   N/A   Inattention:   N/A   Hyperactivity/Impulsivity:   N/A   Oppositional/Defiant Behaviors:   N/A   Emotional Irregularity:   N/A   Other Mood/Personality Symptoms:  No data recorded   Mental Status Exam Appearance and self-care  Stature:   Average   Weight:    Average weight   Clothing:   Neat/clean; Age-appropriate   Grooming:   Normal   Cosmetic use:   None   Posture/gait:   Normal   Motor activity:   -- (within normal range)   Sensorium  Attention:   Normal   Concentration:   Normal   Orientation:   X5   Recall/memory:   Normal   Affect and Mood  Affect:   Anxious   Mood:   Anxious   Relating  Eye contact:   Fleeting   Facial expression:   Responsive; Anxious   Attitude toward examiner:   Cooperative   Thought and Language  Speech flow:  Clear and Coherent   Thought content:   Appropriate to Mood and Circumstances   Preoccupation:   None   Hallucinations:   None   Organization:   Coherent   [employer] of Knowledge:   Fair   Intelligence:   Average   Abstraction:   Functional   Judgement:   Fair   Dance movement psychotherapist:   Adequate  Insight:   Fair   Decision Making:   Impulsive   Social Functioning  Social Maturity:   Impulsive   Social Judgement:   Heedless   Stress  Stressors:   -- (Substance use)   Coping Ability:   Exhausted   Skill Deficits:   Interpersonal   Supports:   Family     Religion:    Leisure/Recreation:    Exercise/Diet: Exercise/Diet Do You Exercise?: No Have You Gained or Lost A Significant Amount of Weight in the Past Six Months?: No Do You Follow a Special Diet?: No Do You Have Any Trouble Sleeping?: No   CCA Employment/Education Employment/Work Situation: Employment / Work Systems developer: On disability Why is Patient on Disability: Mental Health How Long has Patient Been on Disability: Unable to quantify Patient's Job has Been Impacted by Current Illness: No  Education: Education Is Patient Currently Attending School?: No Did You Have An Individualized Education Program (IIEP): No Did You Have Any Difficulty At School?: No Patient's Education Has Been Impacted by Current Illness:  No   CCA Family/Childhood History Family and Relationship History: Family history Marital status: Single Does patient have children?: No  Childhood History:  Childhood History By whom was/is the patient raised?: Mother Did patient suffer any verbal/emotional/physical/sexual abuse as a child?: No Did patient suffer from severe childhood neglect?: No Has patient ever been sexually abused/assaulted/raped as an adolescent or adult?: No Was the patient ever a victim of a crime or a disaster?: No Witnessed domestic violence?: No Has patient been affected by domestic violence as an adult?: No  CCA Substance Use Alcohol/Drug Use: Alcohol / Drug Use Pain Medications: See PTA Prescriptions: See PTA Over the Counter: See PTA History of alcohol / drug use?: Yes Longest period of sobriety (when/how long): Unable to quantify Negative Consequences of Use: Personal relationships, Financial Substance #1 Name of Substance 1: Cocaine   ASAM's:  Six Dimensions of Multidimensional Assessment  Dimension 1:  Acute Intoxication and/or Withdrawal Potential:      Dimension 2:  Biomedical Conditions and Complications:      Dimension 3:  Emotional, Behavioral, or Cognitive Conditions and Complications:     Dimension 4:  Readiness to Change:     Dimension 5:  Relapse, Continued use, or Continued Problem Potential:     Dimension 6:  Recovery/Living Environment:     ASAM Severity Score:    ASAM Recommended Level of Treatment:     Substance use Disorder (SUD)    Recommendations for Services/Supports/Treatments:    Discharge Disposition:    DSM5 Diagnoses: Patient Active Problem List   Diagnosis Date Noted   Chronic pain of right knee 02/06/2016   Rash and nonspecific skin eruption 11/24/2015   Attention deficit hyperactivity disorder 11/23/2015   Impulsiveness 11/23/2015   Depression 11/22/2015   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

## 2022-09-13 NOTE — ED Triage Notes (Signed)
Pt to ED with family for psych eval. Reports has been off abilify for 1 month. Reports pt has been compulsive, walking up and down street saying somebody is going to kill him because he owes money, breaking things, hitting self.  Denies SI/HI.  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  Intellectual disability.  Last cocaine use 1 week ago. Denies ETOH.   Receiving treatment at [facility]

## 2022-09-13 NOTE — ED Notes (Signed)
VOL/  PENDING  CONSULT 

## 2022-09-14 MED ORDER — NICOTINE 14 MG/24HR TD PT24
14.0000 mg | MEDICATED_PATCH | Freq: Every day | TRANSDERMAL | Status: DC
  Administered 2022-09-14 – 2022-09-17 (×4): 14 mg via TRANSDERMAL
  Filled 2022-09-14 (×4): qty 1

## 2022-09-14 NOTE — BH Assessment (Signed)
TTS spoke with Psychiatric Nurse Practitioner Lerry Liner) about patient. He is recommended for inpatient treatment.

## 2022-09-14 NOTE — ED Notes (Signed)
Lunch tray given. 

## 2022-09-14 NOTE — ED Provider Notes (Signed)
Emergency Medicine Observation Re-evaluation Note  Ryan Chen is a 22 y.o. male, seen on rounds today.  Pt initially presented to the ED for complaints of Psychiatric Evaluation  Currently, the patient is resting comfortably.  Physical Exam  BP (!) 133/91 (BP Location: Left Arm)   Pulse 65   Temp 97.8 F (36.6 C) (Oral)   Resp 18   Ht 6' (1.829 m)   Wt 113.4 kg   SpO2 99%   BMI 33.91 kg/m  General: No acute distress Cardiac: Well-perfused extremities Lungs: No respiratory distress Psych: Appropriate mood and affect  ED Course / MDM  EKG:   I have reviewed the labs performed to date as well as medications administered while in observation.  Recent changes in the last 24 hours include none.  Plan  Current plan is for placement.   Merwyn Katos, MD 09/14/22 [phone]

## 2022-09-14 NOTE — ED Notes (Signed)
Mother called at this time to speak to pt- pt asleep. Pt's mother informed pt may call back later once he's awake- mother agreeable with plan.

## 2022-09-14 NOTE — ED Notes (Signed)
Attempted to get pts vitals. Pt snoring at this time. Pt resting comfortably.

## 2022-09-14 NOTE — BH Assessment (Signed)
Per [facility] (Kelly S.,), patient to be referred out of system.  Referral information for Psychiatric Hospitalization faxed to;   [facility] ([phone]-or- [phone]), no available bed  [facility] ([phone]-or- [phone]),   [facility] ([phone]),  [facility] ([phone]--- [phone]--- [phone]--- [phone])  [facility] ([phone]),   [facility] ([phone] -or- [phone]),   [facility] ([phone]),  [facility] ([phone])  [facility] ([phone]).  [facility] ([phone]) 

## 2022-09-14 NOTE — ED Notes (Signed)
Pt still sleeping comfortably. Breakfast tray set at bedside for pt.

## 2022-09-14 NOTE — ED Notes (Signed)
Pt given supplies for shower/hygiene and fresh scrubs/underwear to change into. Pt taking shower at this time.

## 2022-09-14 NOTE — ED Notes (Signed)
VOL PENDING  CONSULT  AND  PLACEMENT

## 2022-09-15 MED ORDER — ONDANSETRON 4 MG PO TBDP: 8.0000 mg | ORAL_TABLET | Freq: Three times a day (TID) | ORAL | Status: DC | PRN

## 2022-09-15 MED ORDER — IBUPROFEN 600 MG PO TABS
600.0000 mg | ORAL_TABLET | Freq: Four times a day (QID) | ORAL | Status: DC | PRN
  Administered 2022-09-15 – 2022-09-17 (×2): 600 mg via ORAL
  Filled 2022-09-15 (×3): qty 1

## 2022-09-15 MED ORDER — MELATONIN 5 MG PO TABS
2.5000 mg | ORAL_TABLET | Freq: Every evening | ORAL | Status: DC | PRN
  Administered 2022-09-15 – 2022-09-16 (×2): 2.5 mg via ORAL
  Filled 2022-09-15 (×2): qty 1

## 2022-09-15 NOTE — ED Provider Notes (Signed)
Emergency Medicine Observation Re-evaluation Note  Ryan Chen is a 22 y.o. male, seen on rounds today.  Pt initially presented to the ED for complaints of Psychiatric Evaluation  Currently, the patient is resting comfortably.  Physical Exam  BP (!) 148/83 (BP Location: Left Arm)   Pulse 62   Temp 98.6 F (37 C) (Oral)   Resp 16   Ht 6' (1.829 m)   Wt 113.4 kg   SpO2 98%   BMI 33.91 kg/m  General: No acute distress Lungs: No respiratory distress Psych: Calm, resting. ED Course / MDM  EKG:   I have reviewed the labs performed to date as well as medications administered while in observation.  Recent changes in the last 24 hours include none.  Plan   Psychiatry team currently planning to place for psychiatric admission    Sharyn Creamer, MD 09/15/22 [phone]

## 2022-09-15 NOTE — ED Notes (Signed)
VOL/pending consult/ inpatient placement

## 2022-09-15 NOTE — ED Notes (Signed)
Pt given snack. 

## 2022-09-15 NOTE — ED Notes (Signed)
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 Pt sitting in the day room.

## 2022-09-15 NOTE — ED Notes (Signed)
Patient is vol pending placement 

## 2022-09-15 NOTE — ED Notes (Signed)
PT has been sleeping 3am-6:43am  no bathroom breaks.

## 2022-09-15 NOTE — ED Notes (Signed)
Provided pt with snacks.

## 2022-09-16 MED ORDER — ARIPIPRAZOLE 15 MG PO TABS
15.0000 mg | ORAL_TABLET | Freq: Every day | ORAL | Status: DC
  Administered 2022-09-17: 15 mg via ORAL
  Filled 2022-09-16: qty 1

## 2022-09-16 MED ORDER — ESCITALOPRAM OXALATE 10 MG PO TABS
10.0000 mg | ORAL_TABLET | Freq: Every day | ORAL | Status: DC
  Administered 2022-09-17: 10 mg via ORAL
  Filled 2022-09-16: qty 1

## 2022-09-16 MED ORDER — LORATADINE 10 MG PO TABS
10.0000 mg | ORAL_TABLET | Freq: Every day | ORAL | Status: DC
  Administered 2022-09-16 – 2022-09-17 (×2): 10 mg via ORAL
  Filled 2022-09-16 (×2): qty 1

## 2022-09-16 NOTE — ED Notes (Signed)
Pt mother here to visit, no issues during visit. Mother leaving behind softbound book for pt. Mother escorted out by security.

## 2022-09-16 NOTE — ED Notes (Addendum)
Pt has been sleeping form 3am-6:32am. Pt awake to use bathroom at 6:33am

## 2022-09-16 NOTE — ED Notes (Signed)
Pt given breakfast tray

## 2022-09-16 NOTE — ED Notes (Signed)
Pt given lunch tray.

## 2022-09-16 NOTE — ED Notes (Signed)
Patient is vol pending toc placement 

## 2022-09-16 NOTE — ED Notes (Signed)
Pt asking for phone to call mom, provided with phone. Pt then comes to the door stating "she wants to speak with you." This RN spoke to pt's mom, giving update on placement and medication. No new meds at this time due to not having a psych physician on site the last couple days.

## 2022-09-16 NOTE — ED Notes (Signed)
Pt given dinner tray.

## 2022-09-16 NOTE — ED Provider Notes (Signed)
Emergency Medicine Observation Re-evaluation Note  Ryan Chen is a 22 y.o. male, seen on rounds today.  Pt initially presented to the ED for complaints of Psychiatric Evaluation Currently, the patient is awaiting placement.  Physical Exam  BP (!) 157/95   Pulse 92   Temp 98.2 F (36.8 C)   Resp 15   Ht 6' (1.829 m)   Wt 113.4 kg   SpO2 98%   BMI 33.91 kg/m  Physical Exam General: no acute distress  ED Course / MDM  No labs in past 24 hours  Plan  Current plan is for placement.    Phineas Semen, MD 09/16/22 [id number]

## 2022-09-17 DIAGNOSIS — F1994 Other psychoactive substance use, unspecified with psychoactive substance-induced mood disorder: Secondary | ICD-10-CM | POA: Insufficient documentation

## 2022-09-17 DIAGNOSIS — F1910 Other psychoactive substance abuse, uncomplicated: Secondary | ICD-10-CM | POA: Insufficient documentation

## 2022-09-17 DIAGNOSIS — F79 Unspecified intellectual disabilities: Secondary | ICD-10-CM

## 2022-09-17 MED ORDER — METHADONE HCL 10 MG PO TABS
30.0000 mg | ORAL_TABLET | Freq: Every day | ORAL | Status: DC
  Administered 2022-09-17: 30 mg via ORAL
  Filled 2022-09-17: qty 3

## 2022-09-17 MED ORDER — ESCITALOPRAM OXALATE 10 MG PO TABS: 10.0000 mg | ORAL_TABLET | Freq: Every day | ORAL | 2 refills | Status: DC

## 2022-09-17 MED ORDER — ARIPIPRAZOLE 15 MG PO TABS: 15.0000 mg | ORAL_TABLET | Freq: Every day | ORAL | 2 refills | Status: DC

## 2022-09-17 NOTE — ED Notes (Signed)
Patient received lunch tray and beverage. 

## 2022-09-17 NOTE — Consult Note (Signed)
[facility] Face-to-Face Psychiatry Consult   Reason for Consult:  Psychiatric Evaluation Referring Physician:  Willy Eddy, MD Patient Identification: Ryan Chen MRN:  [id number] Principal Diagnosis: Substance induced mood disorder (HCC) Diagnosis:  Principal Problem:   Substance induced mood disorder (HCC) Active Problems:   Attention deficit hyperactivity disorder   Polysubstance abuse (HCC)   Intellectual disability   Total Time spent with patient: 30 minutes  Subjective:  "I had an argument with my mom."    HPI:  Patient seen face to face by this provider, consulted with emergency department physician Dr. Larinda Buttery; and chart reviewed on 09/17/22. Ryan Chen, 22 y.o., male with past psychiatric history ADHD, IDD, depression, and PTSD presented voluntarily to [facility] ED due to agitation.    On evaluation Ryan Chen reports worsening anxiety over the past 2 months, characterized by increased tearfulness, difficulty focusing, and decreased appetite, leading to significant weight loss. He is unable to identify specific stressors that may have contributed to her increased anxiety.    Patient denies history of suicidal self-injurious behavior, past suicide attempt or psychiatric hospitalization. He is currently connected with outpatient psychiatry services through [facility] in [place], he is unable to recall his provider's name at this time. He states that he is compliant with medications. He is prescribed Abilify. Patient resides in [place] with his adoptive mother. He identifies his support system as his adoptive parents. He denies access to firearms. Patient acknowledges illicit substance use, cocaine and heroin, last used one week prior to admission. He denies alcohol use. BAL unremarkable. UDS + Cocaine, Cannjbinoid, Opiate, and Methadone. PDMP Review revealed 0 active prescription.   During evaluation Ryan Chen is seated on side of the bed  in no acute distress. He  is alert, oriented x 4, pleasant, calm, and cooperative. Speech is clear and coherent. Objectively there is no evidence of psychosis/mania or delusional thinking. Patient is able to converse coherently, goal directed thoughts, no distractibility, or pre-occupation. He also denies suicidal/self-harm/homicidal ideation, psychosis, and paranoia. Patient answered questions appropriately.   Past Psychiatric History: Charted history of ADHD, Depression, Intellectual disability, PTSD (post-traumatic stress disorder)  Risk to Self:   Risk to Others:   Prior Inpatient Therapy:   Prior Outpatient Therapy:    Past Medical History:  Past Medical History:  Diagnosis Date   ADHD (attention deficit hyperactivity disorder)    Depression    Headache(784.0)    Intellectual disability    PTSD (post-traumatic stress disorder)     Past Surgical History:  Procedure Laterality Date   CIRCUMCISION     TONSILLECTOMY Bilateral 2010   Family History:  Family History  Adopted: Yes  Problem Relation Age of Onset   Other Other        Fhx Heart Problems on Bio Father's Side   Family Psychiatric  History: None reported  Social History:  Social History   Substance and Sexual Activity  Alcohol Use Not Currently     Social History   Substance and Sexual Activity  Drug Use Yes   Types: Cocaine    Social History   Socioeconomic History   Marital status: Single    Spouse name: Not on file   Number of children: Not on file   Years of education: Not on file   Highest education level: Not on file  Occupational History   Not on file  Tobacco Use   Smoking status: Former    Types: Cigarettes   Smokeless tobacco: Never  Vaping Use  Vaping Use: Every day  Substance and Sexual Activity   Alcohol use: Not Currently   Drug use: Yes    Types: Cocaine   Sexual activity: Never  Other Topics Concern   Not on file  Social History Narrative   Not on file   Social Determinants of Health   Financial  Resource Strain: Not on file  Food Insecurity: Not on file  Transportation Needs: Not on file  Physical Activity: Not on file  Stress: Not on file  Social Connections: Not on file   Additional Social History:    Allergies:   Allergies  Allergen Reactions   Soy Allergy Swelling   Amoxicillin Nausea And Vomiting    Ineffective .Marland KitchenHas patient had a PCN reaction causing immediate rash, facial/tongue/throat swelling, SOB or lightheadedness with hypotension: No Has patient had a PCN reaction causing severe rash involving mucus membranes or skin necrosis: No Has patient had a PCN reaction that required hospitalization No Has patient had a PCN reaction occurring within the last 10 years: Yes If all of the above answers are "NO", then may proceed with Cephalosporin use.     Labs: No results found for this or any previous visit (from the past 48 hour(s)).  Current Facility-Administered Medications  Medication Dose Route Frequency Provider Last Rate Last Admin   ARIPiprazole (ABILIFY) tablet 15 mg  15 mg Oral Daily Dixon, Rashaun M, NP   15 mg at 09/17/22 0924   escitalopram (LEXAPRO) tablet 10 mg  10 mg Oral Daily Dixon, Rashaun M, NP   10 mg at 09/17/22 0924   ibuprofen (ADVIL) tablet 600 mg  600 mg Oral Q6H PRN Merwyn Katos, MD   600 mg at 09/17/22 0959   loratadine (CLARITIN) tablet 10 mg  10 mg Oral Daily Willy Eddy, MD   10 mg at 09/17/22 1610   melatonin tablet 2.5 mg  2.5 mg Oral QHS PRN Merwyn Katos, MD   2.5 mg at 09/16/22 2120   methadone (DOLOPHINE) tablet 30 mg  30 mg Oral Daily Chesley Noon, MD   30 mg at 09/17/22 1035   nicotine (NICODERM CQ - dosed in mg/24 hours) patch 14 mg  14 mg Transdermal Daily Willy Eddy, MD   14 mg at 09/17/22 0924   ondansetron (ZOFRAN-ODT) disintegrating tablet 8 mg  8 mg Oral Q8H PRN Merwyn Katos, MD       Current Outpatient Medications  Medication Sig Dispense Refill   ARIPiprazole (ABILIFY) 15 MG tablet Take 1 tablet  (15 mg total) by mouth daily. 30 tablet 2   escitalopram (LEXAPRO) 10 MG tablet Take 1 tablet (10 mg total) by mouth daily. 30 tablet 2   loratadine (CLARITIN) 10 MG tablet Take 10 mg by mouth daily.     naloxone (NARCAN) nasal spray 4 mg/0.1 mL sign 2 each 0    Musculoskeletal: Strength & Muscle Tone: within normal limits Gait & Station: normal Patient leans: N/A            Psychiatric Specialty Exam:  Presentation  General Appearance: Appropriate for Environment  Eye Contact:Good  Speech:Clear and Coherent  Speech Volume:Normal  Handedness:Right   Mood and Affect  Mood:-- (Calma nd cooperative)  Affect:Appropriate; Congruent   Thought Process  Thought Processes:Coherent; Linear  Descriptions of Associations:Intact  Orientation:Full (Time, Place and Person)  Thought Content:Logical; WDL  History of Schizophrenia/Schizoaffective disorder:No  Duration of Psychotic Symptoms:No data recorded Hallucinations:Hallucinations: None  Ideas of Reference:None  Suicidal Thoughts:Suicidal Thoughts: No  Homicidal Thoughts:Homicidal Thoughts: No   Sensorium  Memory:Immediate Good; Recent Good  Judgment:Fair  Insight:Fair   Executive Functions  Concentration:Good  Attention Span:Good  Recall:Good  Fund of Knowledge:Good  Language:Good   Psychomotor Activity  Psychomotor Activity:Psychomotor Activity: Normal   Assets  Assets:Desire for Improvement; Housing; Physical Health; Social Support   Sleep  Sleep:Sleep: Good   Physical Exam: Physical Exam Vitals and nursing note reviewed.  HENT:     Head: Normocephalic.     Nose: Nose normal.  Cardiovascular:     Comments: DBP Elevated: 118/91  Pulmonary:     Effort: Pulmonary effort is normal.  Musculoskeletal:        General: Normal range of motion.     Cervical back: Normal range of motion.  Neurological:     Mental Status: He is alert and oriented to person, place, and time.      Comments: Patient has charted history of Intellectual disability   Psychiatric:        Attention and Perception: Attention normal. He does not perceive auditory or visual hallucinations.        Mood and Affect: Mood and affect normal.        Speech: Speech normal.        Behavior: Behavior is cooperative.        Thought Content: Thought content normal. Thought content is not paranoid or delusional. Thought content does not include homicidal or suicidal ideation.        Cognition and Memory: Cognition and memory normal.        Judgment: Judgment normal.    ROS Blood pressure (!) 118/91, pulse 97, temperature 98.5 F (36.9 C), temperature source Oral, resp. rate 18, height 6' (1.829 m), weight 113.4 kg, SpO2 99 %. Body mass index is 33.91 kg/m.  Treatment Plan Summary: Plan : 22 y.o. male presented to the ED   Disposition:  No evidence of imminent risk to self or others at present.   Patient does not meet criteria for psychiatric inpatient admission. Supportive therapy provided about ongoing stressors. Refer to IOP. Discussed crisis plan, support from social network, calling 911, coming to the Emergency Department, and calling Suicide Hotline.  Norma Fredrickson, NP 09/17/2022 5:30 PM

## 2022-09-17 NOTE — BH Assessment (Signed)
Writer left message for patient's mom to return call.  Writer received call from mom informing that patient will be discharged and given resources for treatment. Mom says she is able to come pick up patient at 4:00pm. Mom inquired about patient receiving Abilify shot which works best as patient forgets to take pills. Writer provided mom with number for patient's nurse.

## 2022-09-17 NOTE — ED Notes (Signed)
MD did received call clarifying His dos of methadone that He has been taking and it was reordered.

## 2022-09-17 NOTE — ED Notes (Signed)
Breakfast tray provided. 

## 2022-09-17 NOTE — ED Notes (Signed)
Patient is calm and cooperative, takes all medications, and also took shower this am, His Mom called and states that He is on a waiting list for rehab, and she does not know when He will have a bed, but she states that if He comes home she knows that He will just go back to the drugs and that they adopted him and His whole biological family is on drugs, she states that His IQ has always been low , but since the drugs it is much worse> Mom also states that He needs His methadone back started because if He is off of it too long that He want be able to go to the rehab facility. Patient has been cooperative, no behavioral issues at this time, staff will continue to monitor.

## 2022-09-17 NOTE — ED Notes (Addendum)
Shower supplies provided and discarded appropriately

## 2022-09-17 NOTE — ED Notes (Signed)
Patient with discharge information, His Mom states that she would come around 4 pm, Nurse called and let her know that He would be moving to the quad to sit and wait for her and she states that she is trying to come earlier, she is very supportive, Patient is getting dressed at this time and will be escorted to the quad .

## 2022-09-17 NOTE — ED Provider Notes (Addendum)
Emergency Medicine Observation Re-evaluation Note  Ryan Chen is a 22 y.o. male, seen on rounds today.  Pt initially presented to the ED for complaints of Psychiatric Evaluation Currently, the patient is sleeping comfortably, no issues overnight per staff.  Physical Exam  BP 135/83 (BP Location: Right Arm)   Pulse 60   Temp 98.5 F (36.9 C) (Oral)   Resp 18   Ht 6' (1.829 m)   Wt 113.4 kg   SpO2 100%   BMI 33.91 kg/m  Physical Exam Patient appears well, no acute distress, normal WOB    ED Course / MDM  EKG:   I have reviewed the labs performed to date as well as medications administered while in observation.  Recent changes in the last 24 hours include none.  Plan  Current plan is for discharge home, patient cleared by psychiatry    Chesley Noon, MD 09/17/22 4098    Chesley Noon, MD 09/17/22 1322

## 2022-09-17 NOTE — ED Notes (Signed)
NP talked to the patient and He remained calm and cooperative.

## 2022-09-17 NOTE — ED Notes (Signed)
Vol/pending consult/inpatient placement.

## 2022-10-16 ENCOUNTER — Observation Stay
Admission: EM | Admit: 2022-10-16 | Discharge: 2022-10-18 | Disposition: A | Discharge status: Home / Self Care | Payer: MEDICAID | Attending: Internal Medicine | Admitting: Internal Medicine

## 2022-10-16 ENCOUNTER — Emergency Department: Payer: MEDICAID

## 2022-10-16 DIAGNOSIS — G9341 Metabolic encephalopathy: Secondary | ICD-10-CM

## 2022-10-16 DIAGNOSIS — T50904A Poisoning by unspecified drugs, medicaments and biological substances, undetermined, initial encounter: Principal | ICD-10-CM

## 2022-10-16 DIAGNOSIS — T43291A Poisoning by other antidepressants, accidental (unintentional), initial encounter: Principal | ICD-10-CM | POA: Insufficient documentation

## 2022-10-16 DIAGNOSIS — F314 Bipolar disorder, current episode depressed, severe, without psychotic features: Secondary | ICD-10-CM | POA: Diagnosis present

## 2022-10-16 DIAGNOSIS — F79 Unspecified intellectual disabilities: Secondary | ICD-10-CM

## 2022-10-16 DIAGNOSIS — E669 Obesity, unspecified: Secondary | ICD-10-CM | POA: Insufficient documentation

## 2022-10-16 DIAGNOSIS — F1910 Other psychoactive substance abuse, uncomplicated: Secondary | ICD-10-CM | POA: Diagnosis present

## 2022-10-16 DIAGNOSIS — Z87891 Personal history of nicotine dependence: Secondary | ICD-10-CM | POA: Diagnosis not present

## 2022-10-16 DIAGNOSIS — R7989 Other specified abnormal findings of blood chemistry: Secondary | ICD-10-CM | POA: Diagnosis not present

## 2022-10-16 DIAGNOSIS — T50901A Poisoning by unspecified drugs, medicaments and biological substances, accidental (unintentional), initial encounter: Secondary | ICD-10-CM | POA: Diagnosis present

## 2022-10-16 LAB — CBC
HCT: 43 % (ref 39.0–52.0)
Hemoglobin: 14.1 g/dL (ref 13.0–17.0)
MCH: 30.1 pg (ref 26.0–34.0)
MCHC: 32.8 g/dL (ref 30.0–36.0)
MCV: 91.7 fL (ref 80.0–100.0)
Platelets: 158 10*3/uL (ref 150–400)
RBC: 4.69 MIL/uL (ref 4.22–5.81)
RDW: 13.2 % (ref 11.5–15.5)
WBC: 7.5 10*3/uL (ref 4.0–10.5)
nRBC: 0 % (ref 0.0–0.2)

## 2022-10-16 LAB — CBG MONITORING, ED: Glucose-Capillary: 107 mg/dL — ABNORMAL HIGH (ref 70–99)

## 2022-10-16 MED ORDER — SODIUM CHLORIDE 0.9 % IV BOLUS
1000.0000 mL | Freq: Once | INTRAVENOUS | Status: AC
  Administered 2022-10-17: 1000 mL via INTRAVENOUS

## 2022-10-16 NOTE — ED Triage Notes (Signed)
Pt arrived via [facility] from home with reports of drug overdose. Pt admits to taking about 350mg  Trazodone and unknown MG of oxycodone, pt reported he was given 1 blue pill that was supposed to be oxycodone, pt states he was "trying to go to sleep" denies any SI/HI.  Pt is alert, frequently drowsy.

## 2022-10-16 NOTE — ED Provider Notes (Signed)
[facility] Provider Note    Event Date/Time   First MD Initiated Contact with Patient 10/16/22 2317     (approximate)   History   Overdose   HPI  Level V caveat: Limited by somnolence  Ryan Chen is a 22 y.o. male brought to the ED via EMS from home with a chief complaint of overdose.  Patient with a history of ADHD, PTSD, intellectual disability who admits to taking about 350 mg Trazodone, and unknown amount of oxycodone as well as Abilify.  Recent discharge from rehab.  States he was "trying to go to sleep".  Denies active SI/HI/AH/VH.  Voices no medical complaints.     Past Medical History   Past Medical History:  Diagnosis Date   ADHD (attention deficit hyperactivity disorder)    Depression    Headache(784.0)    Intellectual disability    PTSD (post-traumatic stress disorder)      Active Problem List   Patient Active Problem List   Diagnosis Date Noted   Substance induced mood disorder (HCC) 09/17/2022   Polysubstance abuse (HCC) 09/17/2022   Intellectual disability 09/17/2022   Chronic pain of right knee 02/06/2016   Rash and nonspecific skin eruption 11/24/2015   Attention deficit hyperactivity disorder 11/23/2015   Impulsiveness 11/23/2015   Depression 11/22/2015   Adjustment disorder of adolescence 11/21/2015   Migraine without aura and without status migrainosus, not intractable 07/31/2012   Anxiety state, unspecified 07/31/2012   Generalized anxiety disorder 07/31/2012     Past Surgical History   Past Surgical History:  Procedure Laterality Date   CIRCUMCISION     TONSILLECTOMY Bilateral 2010     Home Medications   Prior to Admission medications   Medication Sig Start Date End Date Taking? Authorizing Provider  ARIPiprazole (ABILIFY) 15 MG tablet Take 1 tablet (15 mg total) by mouth daily. 09/17/22 12/16/22  Chesley Noon, MD  escitalopram (LEXAPRO) 10 MG tablet Take 1 tablet (10 mg total) by mouth daily.  09/17/22 09/17/23  Chesley Noon, MD  loratadine (CLARITIN) 10 MG tablet Take 10 mg by mouth daily.    [provider]  naloxone Jonelle Sports) nasal spray 4 mg/0.1 mL sign 06/11/22   Corena Herter, MD  sertraline (ZOLOFT) 50 MG tablet Take 1 tablet by mouth daily. 03/07/17 08/17/19  [provider]  traZODone (DESYREL) 50 MG tablet TK 1 T PO QHS 02/21/18 08/17/19  [provider]  VYVANSE 40 MG capsule Take one capsule by mouth every morning 01/07/16 08/17/19  [provider]     Allergies  Soy allergy and Amoxicillin   Family History   Family History  Adopted: Yes  Problem Relation Age of Onset   Other Other        Fhx Heart Problems on Bio Father's Side     Physical Exam  Triage Vital Signs: ED Triage Vitals  Encounter Vitals Group     BP      Systolic BP Percentile      Diastolic BP Percentile      Pulse      Resp      Temp      Temp src      SpO2      Weight      Height      Head Circumference      Peak Flow      Pain Score      Pain Loc      Pain Education  Exclude from Growth Chart     Updated Vital Signs: BP 131/79   Pulse (!) 124   Temp 98.9 F (37.2 C) (Oral)   Resp 18   Ht 6' (1.829 m)   Wt 128.2 kg   SpO2 99%   BMI 38.33 kg/m    General: Drowsy but arousable to voice, mild distress.  CV:  Tachycardic.  Good peripheral perfusion.  Resp:  Normal effort.  CTAB. Abd:  Nontender.  No distention.  Other:  Awakens to voice.  Alert and oriented to person and place.  Pinpoint pupils.  CN II-XII grossly intact.  5/5 motor strength and sensation all extremities.  M AE x 4.   ED Results / Procedures / Treatments  Labs (all labs ordered are listed, but only abnormal results are displayed) Labs Reviewed  CBG MONITORING, ED - Abnormal; Notable for the following components:      Result Value   Glucose-Capillary 107 (*)    All other components within normal limits  CBC  COMPREHENSIVE METABOLIC PANEL  ACETAMINOPHEN LEVEL   ETHANOL  LIPASE, BLOOD  SALICYLATE LEVEL  URINE DRUG SCREEN, QUALITATIVE ([facility] ONLY)  TROPONIN I (HIGH SENSITIVITY)     EKG  ED ECG REPORT I, Mukesh Kornegay J, the attending physician, personally viewed and interpreted this ECG.   Date: 10/16/2022  EKG Time: 2322  Rate: 131  Rhythm: sinus tachycardia  Axis: Normal  Intervals:none  ST&T Change: Nonspecific    RADIOLOGY I have independently visualized and interpreted patient CT and chest x-ray as well as noted the radiology interpretation:  CT head:  Chest x-ray:  Official radiology report(s): No results found.   PROCEDURES:  Critical Care performed: Yes, see critical care procedure note(s)  CRITICAL CARE Performed by: Irean Hong   Total critical care time: *** minutes  Critical care time was exclusive of separately billable procedures and treating other patients.  Critical care was necessary to treat or prevent imminent or life-threatening deterioration.  Critical care was time spent personally by me on the following activities: development of treatment plan with patient and/or surrogate as well as nursing, discussions with consultants, evaluation of patient's response to treatment, examination of patient, obtaining history from patient or surrogate, ordering and performing treatments and interventions, ordering and review of laboratory studies, ordering and review of radiographic studies, pulse oximetry and re-evaluation of patient's condition.   Marland Kitchen1-3 Lead EKG Interpretation  Performed by: Irean Hong, MD Authorized by: Irean Hong, MD     Interpretation: abnormal     ECG rate:  130   ECG rate assessment: tachycardic     Rhythm: sinus tachycardia     Ectopy: none     Conduction: normal   Comments:     Patient placed on cardiac monitor to evaluate for arrhythmias    MEDICATIONS ORDERED IN ED: Medications  sodium chloride 0.9 % bolus 1,000 mL (has no administration in time range)     IMPRESSION /  MDM / ASSESSMENT AND PLAN / ED COURSE  I reviewed the triage vital signs and the nursing notes.                             22 year old male presenting to the ED status post overdose. Differential diagnosis includes, but is not limited to, alcohol, illicit or prescription medications, or other toxic ingestion; intracranial pathology such as stroke or intracerebral hemorrhage; fever or infectious causes including sepsis; hypoxemia and/or hypercarbia; uremia; trauma;  endocrine related disorders such as diabetes, hypoglycemia, and thyroid-related diseases; hypertensive encephalopathy; etc. I have personally reviewed patient's records and note a recent ED visit for agitation on 09/13/2022.  Prior to that he had 2 ED visits in April and May for heroin use and opioid overdose.  Patient's presentation is most consistent with acute presentation with potential threat to life or bodily function.  The patient is on the cardiac monitor to evaluate for evidence of arrhythmia and/or significant heart rate changes.  Will obtain toxicological lab work, CT head, chest x-ray.  Initiate IV fluid resuscitation.  Place patient under IVC for his safety.  Will consult poison control.      FINAL CLINICAL IMPRESSION(S) / ED DIAGNOSES   Final diagnoses:  Overdose of undetermined intent, initial encounter     Rx / DC Orders   ED Discharge Orders     None        Note:  This document was prepared using Dragon voice recognition software and may include unintentional dictation errors.

## 2022-10-17 ENCOUNTER — Encounter: Payer: Self-pay | Admitting: Emergency Medicine

## 2022-10-17 ENCOUNTER — Other Ambulatory Visit: Payer: Self-pay

## 2022-10-17 DIAGNOSIS — R7989 Other specified abnormal findings of blood chemistry: Secondary | ICD-10-CM

## 2022-10-17 DIAGNOSIS — T50901A Poisoning by unspecified drugs, medicaments and biological substances, accidental (unintentional), initial encounter: Secondary | ICD-10-CM | POA: Diagnosis present

## 2022-10-17 DIAGNOSIS — G9341 Metabolic encephalopathy: Secondary | ICD-10-CM

## 2022-10-17 DIAGNOSIS — T50904A Poisoning by unspecified drugs, medicaments and biological substances, undetermined, initial encounter: Secondary | ICD-10-CM | POA: Diagnosis not present

## 2022-10-17 LAB — ACETAMINOPHEN LEVEL: Acetaminophen (Tylenol), Serum: <10 ug/mL — ABNORMAL LOW (ref 10–30)

## 2022-10-17 LAB — HIV ANTIBODY (ROUTINE TESTING W REFLEX): HIV Screen 4th Generation wRfx: NONREACTIVE

## 2022-10-17 LAB — TROPONIN I (HIGH SENSITIVITY): Troponin I (High Sensitivity): 54 ng/L — ABNORMAL HIGH (ref <18)

## 2022-10-17 LAB — SALICYLATE LEVEL: Salicylate Lvl: <7 mg/dL — ABNORMAL LOW (ref 7.0–30.0)

## 2022-10-17 MED ORDER — ONDANSETRON HCL 4 MG PO TABS: 4.0000 mg | ORAL_TABLET | Freq: Four times a day (QID) | ORAL | Status: DC | PRN

## 2022-10-17 MED ORDER — METHADONE HCL 10 MG PO TABS
60.0000 mg | ORAL_TABLET | Freq: Every day | ORAL | Status: DC
  Administered 2022-10-17 – 2022-10-18 (×2): 60 mg via ORAL
  Filled 2022-10-17 (×2): qty 6

## 2022-10-17 MED ORDER — ACETAMINOPHEN 325 MG RE SUPP: 650.0000 mg | Freq: Four times a day (QID) | RECTAL | Status: DC | PRN

## 2022-10-17 MED ORDER — ONDANSETRON HCL 4 MG/2ML IJ SOLN: 4.0000 mg | Freq: Four times a day (QID) | INTRAMUSCULAR | Status: DC | PRN

## 2022-10-17 MED ORDER — ESCITALOPRAM OXALATE 10 MG PO TABS
10.0000 mg | ORAL_TABLET | Freq: Every day | ORAL | Status: DC
  Administered 2022-10-17 – 2022-10-18 (×2): 10 mg via ORAL
  Filled 2022-10-17 (×2): qty 1

## 2022-10-17 MED ORDER — ACETAMINOPHEN 325 MG PO TABS
650.0000 mg | ORAL_TABLET | Freq: Four times a day (QID) | ORAL | Status: DC | PRN
  Administered 2022-10-18 (×2): 650 mg via ORAL
  Filled 2022-10-17 (×2): qty 2

## 2022-10-17 MED ORDER — ARIPIPRAZOLE 15 MG PO TABS
15.0000 mg | ORAL_TABLET | Freq: Every day | ORAL | Status: DC
  Administered 2022-10-17 – 2022-10-18 (×2): 15 mg via ORAL
  Filled 2022-10-17 (×2): qty 1

## 2022-10-17 MED ORDER — BUSPIRONE HCL 5 MG PO TABS
15.0000 mg | ORAL_TABLET | Freq: Three times a day (TID) | ORAL | Status: DC
  Administered 2022-10-17 – 2022-10-18 (×4): 15 mg via ORAL
  Filled 2022-10-17 (×4): qty 3

## 2022-10-17 MED ORDER — METHADONE HCL 10 MG/ML PO CONC: 60.0000 mg | Freq: Every day | ORAL | Status: DC

## 2022-10-17 NOTE — ED Notes (Signed)
Ivc by MD Dolores Frame

## 2022-10-17 NOTE — Assessment & Plan Note (Signed)
Mood disorder-ADHD History of polysubstance abuse Behavioral health consult in the a.m. Will hold Abilify, buspirone, Lexapro, sertraline and trazodone and Vyvanse until seen by psych in the a.m.

## 2022-10-17 NOTE — Assessment & Plan Note (Signed)
Troponin bump from 19-54 Likely demand ischemia.  Low suspicion for ACS Cardiac monitoring

## 2022-10-17 NOTE — Progress Notes (Signed)
  Progress Note   Patient: Ryan Chen NGE:[id number] DOB: Apr 28, 2000 DOA: 10/16/2022     0 DOS: the patient was seen and examined on 10/17/2022   Brief hospital course:  Ryan Chen is a 22 y.o. male with medical history significant for Intellectual disability, substance abuse, mood disorder who arrived by EMS for about an overdose.  Patient took trazodone 350 mg, unknown amount of Abilify and unknown amount of oxycodone which she states he took to try to go to sleep.    Principal Problem:   Overdose Active Problems:   Overdose, undetermined intent, initial encounter   Acute toxic-metabolic encephalopathy   Elevated troponin   Polysubstance abuse (HCC)   Intellectual disability   Assessment and Plan: Overdose, undetermined intent, initial encounter Acute toxic metabolic encephalopathy secondary to overdose Patient admitted status improved, contacted psychiatry to assess.   Elevated troponin Troponin bump from 19-54 Likely demand ischemia.  Low suspicion for ACS No evidence of ACS.  Intellectual disability Mood disorder-ADHD History of polysubstance abuse Resume home medicine except holding trazodone. Discussed with pharmacy, patient was also taking higher dose of methadone, which has been confirmed with outpatient pharmacy.  Restarted.  Obesity with BMI 38.33. Diet and excise.       Subjective: Patient currently has no complaints.  Physical Exam: Vitals:   10/17/22 1330 10/17/22 1332 10/17/22 1545 10/17/22 1600  BP: (!) 142/85   130/86  Pulse:  (!) 56  (!) 56  Resp: 18 19 16 13   Temp:      TempSrc:      SpO2:  98%  98%  Weight:      Height:       General exam: Appears calm and comfortable  Respiratory system: Clear to auscultation. Respiratory effort normal. Cardiovascular system: S1 & S2 heard, RRR. No JVD, murmurs, rubs, gallops or clicks. No pedal edema. Gastrointestinal system: Abdomen is nondistended, soft and nontender. No organomegaly or  masses felt. Normal bowel sounds heard. Central nervous system: Alert and oriented. No focal neurological deficits. Extremities: Symmetric 5 x 5 power. Skin: No rashes, lesions or ulcers Psychiatry: Judgement and insight appear normal. Mood & affect appropriate.    Data Reviewed:  Lab results reviewed.  Family Communication: None  Disposition: Status is: Observation      Time spent: no charge minutes  Author: Marrion Coy, MD 10/17/2022 5:03 PM  For on call review [email or web address].

## 2022-10-17 NOTE — ED Notes (Signed)
IVC/  PENDING  PLACEMENT 

## 2022-10-17 NOTE — Hospital Course (Signed)
Ryan Chen is a 22 y.o. male with medical history significant for Intellectual disability, substance abuse, mood disorder who arrived by EMS for about an overdose.  Patient took trazodone 350 mg, unknown amount of Abilify and unknown amount of oxycodone which she states he took to try to go to sleep.  Patient was monitored overnight, he was seen by psychiatry, recommended transfer to psychiatry for treatment.  Today pending bed assignment.

## 2022-10-17 NOTE — H&P (Signed)
History and Physical    Patient: Ryan Chen WGN:[id number] DOB: 04/21/2000 DOA: 10/16/2022 DOS: the patient was seen and examined on 10/17/2022 PCP: Jerl Mina, MD  Patient coming from: Home  Chief Complaint:  Chief Complaint  Patient presents with   Drug Overdose    HPI: Ryan Chen is a 22 y.o. male with medical history significant for Intellectual disability, substance abuse, mood disorder who arrived by EMS for about an overdose.  Patient took trazodone 350 mg, unknown amount of Abilify and unknown amount of oxycodone which she states he took to try to go to sleep.  He denied suicidal or homicidal ideation and voices no medical complaints. ED course and data reviewed: On arrival tachycardic to 124, improving following 1 L NS bolus.  Vitals otherwise unremarkable. LabsNotable for troponin bump 19-54.  UDS pending.  CBC and CMP unremarkable.  EtOH, salicylate acetaminophen levels below detectable. EKG, personally viewed and interpreted shows sinus rhythm at 77 Chest x-ray with bibasilar atelectasis and head CT nonacute except for unchanged paranasal sinus mucosal thickening. Patient was drowsy but arousable.  Narcan not administered. Hospitalist consulted for medical clearance prior to psych assessment for possible inpatient psych   Review of Systems: As mentioned in the history of present illness. All other systems reviewed and are negative.  Past Medical History:  Diagnosis Date   ADHD (attention deficit hyperactivity disorder)    Depression    Headache(784.0)    Intellectual disability    PTSD (post-traumatic stress disorder)    Past Surgical History:  Procedure Laterality Date   CIRCUMCISION     TONSILLECTOMY Bilateral 2010   Social History:  reports that he has quit smoking. His smoking use included cigarettes. He has never used smokeless tobacco. He reports that he does not currently use alcohol. He reports current drug use. Drug: Cocaine.  Allergies   Allergen Reactions   Soy Allergy Swelling   Amoxicillin Nausea And Vomiting    Ineffective .Marland KitchenHas patient had a PCN reaction causing immediate rash, facial/tongue/throat swelling, SOB or lightheadedness with hypotension: No Has patient had a PCN reaction causing severe rash involving mucus membranes or skin necrosis: No Has patient had a PCN reaction that required hospitalization No Has patient had a PCN reaction occurring within the last 10 years: Yes If all of the above answers are "NO", then may proceed with Cephalosporin use.     Family History  Adopted: Yes  Problem Relation Age of Onset   Other Other        Fhx Heart Problems on Bio Father's Side    Prior to Admission medications   Medication Sig Start Date End Date Taking? Authorizing Provider  ABILIFY MAINTENA 400 MG SRER injection Inject 400 mg into the muscle every 28 (twenty-eight) days. 09/26/22   [provider]  ARIPiprazole (ABILIFY) 15 MG tablet Take 1 tablet (15 mg total) by mouth daily. 09/17/22 12/16/22  Chesley Noon, MD  busPIRone (BUSPAR) 15 MG tablet Take 15 mg by mouth 3 (three) times daily. 10/11/22   [provider]  escitalopram (LEXAPRO) 10 MG tablet Take 1 tablet (10 mg total) by mouth daily. 09/17/22 09/17/23  Chesley Noon, MD  loratadine (CLARITIN) 10 MG tablet Take 10 mg by mouth daily.    [provider]  naloxone [employer]elle [employer]) nasal spray 4 mg/0.1 mL sign 06/11/22   Corena Herter, MD  sertraline (ZOLOFT) 50 MG tablet Take 1 tablet by mouth daily. 03/07/17 08/17/19  [provider]  traZODone (DESYREL) 50 MG  tablet TK 1 T PO QHS 02/21/18 08/17/19  [provider]  VYVANSE 40 MG capsule Take one capsule by mouth every morning 01/07/16 08/17/19  [provider]    Physical Exam: Vitals:   10/17/22 0130 10/17/22 0200 10/17/22 0230 10/17/22 0300  BP: 119/66 (!) 115/59 (!) 108/58 111/66  Pulse: 87 83 77 80  Resp: 13 14 13 12   Temp:      TempSrc:      SpO2: 92%  97% 96% 97%  Weight:      Height:       Physical Exam Vitals and nursing note reviewed.  Constitutional:      General: He is sleeping. He is not in acute distress. HENT:     Head: Normocephalic and atraumatic.  Cardiovascular:     Rate and Rhythm: Normal rate and regular rhythm.     Heart sounds: Normal heart sounds.  Pulmonary:     Effort: Pulmonary effort is normal.     Breath sounds: Normal breath sounds.  Abdominal:     Palpations: Abdomen is soft.     Tenderness: There is no abdominal tenderness.  Neurological:     Mental Status: He is easily aroused. Mental status is at baseline.     Labs on Admission: I have personally reviewed following labs and imaging studies  CBC: Recent Labs  Lab 10/16/22 2326  WBC 7.5  HGB 14.1  HCT 43.0  MCV 91.7  PLT 158   Basic Metabolic Panel: Recent Labs  Lab 10/16/22 2326  NA 139  K 3.6  CL 110  CO2 23  GLUCOSE 123*  BUN 16  CREATININE 1.08  CALCIUM 9.1   GFR: Estimated Creatinine Clearance: 147.2 mL/min (by C-G formula based on SCr of 1.08 mg/dL). Liver Function Tests: Recent Labs  Lab 10/16/22 2326  AST 25  ALT 24  ALKPHOS 47  BILITOT 0.3  PROT 6.3*  ALBUMIN 3.7   Recent Labs  Lab 10/16/22 2326  LIPASE 27   No results for input(s): "AMMONIA" in the last 168 hours. Coagulation Profile: No results for input(s): "INR", "PROTIME" in the last 168 hours. Cardiac Enzymes: No results for input(s): "CKTOTAL", "CKMB", "CKMBINDEX", "TROPONINI" in the last 168 hours. BNP (last 3 results) No results for input(s): "PROBNP" in the last 8760 hours. HbA1C: No results for input(s): "HGBA1C" in the last 72 hours. CBG: Recent Labs  Lab 10/16/22 2325  GLUCAP 107*   Lipid Profile: No results for input(s): "CHOL", "HDL", "LDLCALC", "TRIG", "CHOLHDL", "LDLDIRECT" in the last 72 hours. Thyroid Function Tests: No results for input(s): "TSH", "T4TOTAL", "FREET4", "T3FREE", "THYROIDAB" in the last 72 hours. Anemia  Panel: No results for input(s): "VITAMINB12", "FOLATE", "FERRITIN", "TIBC", "IRON", "RETICCTPCT" in the last 72 hours. Urine analysis:    Component Value Date/Time   COLORURINE YELLOW (A) 05/12/2017 1346   APPEARANCEUR CLEAR (A) 05/12/2017 1346   LABSPEC 1.013 05/12/2017 1346   PHURINE 6.0 05/12/2017 1346   GLUCOSEU NEGATIVE 05/12/2017 1346   HGBUR NEGATIVE 05/12/2017 1346   BILIRUBINUR NEGATIVE 05/12/2017 1346   KETONESUR NEGATIVE 05/12/2017 1346   PROTEINUR NEGATIVE 05/12/2017 1346   NITRITE NEGATIVE 05/12/2017 1346   LEUKOCYTESUR NEGATIVE 05/12/2017 1346    Radiological Exams on Admission: DG Chest Port 1 View  Result Date: 10/16/2022 CLINICAL DATA:  Overdose. EXAM: PORTABLE CHEST 1 VIEW COMPARISON:  05/20/2019 FINDINGS: Patient's chin obscures the apices. Overall low lung volumes. Mild bibasilar atelectasis. The heart is grossly normal in size. No pneumothorax or large pleural effusion.  IMPRESSION: Low lung volumes with bibasilar atelectasis. Electronically Signed   By: Narda Rutherford M.D.   On: 10/16/2022 23:49   CT Head Wo Contrast  Result Date: 10/16/2022 CLINICAL DATA:  Mental status change, unknown cause EXAM: CT HEAD WITHOUT CONTRAST TECHNIQUE: Contiguous axial images were obtained from the base of the skull through the vertex without intravenous contrast. RADIATION DOSE REDUCTION: This exam was performed according to the departmental dose-optimization program which includes automated exposure control, adjustment of the mA and/or kV according to patient size and/or use of iterative reconstruction technique. COMPARISON:  Head CT 06/11/2023 FINDINGS: Brain: No intracranial hemorrhage, mass effect, or midline shift. No hydrocephalus. The basilar cisterns are patent. No evidence of territorial infarct or acute ischemia. No extra-axial or intracranial fluid collection. Vascular: No hyperdense vessel or unexpected calcification. Skull: No fracture or focal lesion. Sinuses/Orbits:  Paranasal sinus mucosal thickening is similar to prior exam. No sinus fluid levels. No mastoid effusion. No acute orbital findings. Other: None. IMPRESSION: 1. No acute intracranial abnormality. 2. Unchanged paranasal sinus mucosal thickening. Electronically Signed   By: Narda Rutherford M.D.   On: 10/16/2022 23:41     Data Reviewed: Relevant notes from primary care and specialist visits, past discharge summaries as available in EHR, including Care Everywhere. Prior diagnostic testing as pertinent to current admission diagnoses Updated medications and problem lists for reconciliation ED course, including vitals, labs, imaging, treatment and response to treatment Triage notes, nursing and pharmacy notes and ED provider's notes Notable results as noted in HPI   Assessment and Plan: Overdose, undetermined intent, initial encounter Acute toxic metabolic encephalopathy secondary to overdose Neurologic checks One-to-one precautions Patient currently IVC Psych consult in the a.m. Follow-up UDS  Elevated troponin Troponin bump from 19-54 Likely demand ischemia.  Low suspicion for ACS Cardiac monitoring  Intellectual disability Mood disorder-ADHD History of polysubstance abuse Behavioral health consult in the a.m. Will hold Abilify, buspirone, Lexapro, sertraline and trazodone and Vyvanse until seen by psych in the a.m.    DVT prophylaxis: none  Consults: none  Advance Care Planning:   Code Status: Prior   Family Communication: none  Disposition Plan: Back to previous home environment  Severity of Illness: The appropriate patient status for this patient is OBSERVATION. Observation status is judged to be reasonable and necessary in order to provide the required intensity of service to ensure the patient's safety. The patient's presenting symptoms, physical exam findings, and initial radiographic and laboratory data in the context of their medical condition is felt to place them at  decreased risk for further clinical deterioration. Furthermore, it is anticipated that the patient will be medically stable for discharge from the hospital within 2 midnights of admission.   Author: Andris Baumann, MD 10/17/2022 3:40 AM  For on call review [email or web address].

## 2022-10-17 NOTE — ED Notes (Signed)
Meal tray delivered.

## 2022-10-17 NOTE — Assessment & Plan Note (Addendum)
Acute toxic metabolic encephalopathy secondary to overdose Neurologic checks One-to-one precautions Patient currently IVC Psych consult in the a.m. Follow-up UDS

## 2022-10-18 DIAGNOSIS — G9341 Metabolic encephalopathy: Secondary | ICD-10-CM

## 2022-10-18 DIAGNOSIS — F314 Bipolar disorder, current episode depressed, severe, without psychotic features: Secondary | ICD-10-CM

## 2022-10-18 DIAGNOSIS — T50904A Poisoning by unspecified drugs, medicaments and biological substances, undetermined, initial encounter: Secondary | ICD-10-CM | POA: Diagnosis not present

## 2022-10-18 DIAGNOSIS — E669 Obesity, unspecified: Secondary | ICD-10-CM | POA: Insufficient documentation

## 2022-10-18 MED ORDER — TRAZODONE HCL 50 MG PO TABS: 150.0000 mg | ORAL_TABLET | Freq: Every day | ORAL | Status: DC

## 2022-10-18 NOTE — ED Notes (Signed)
Breakfast tray provided. 

## 2022-10-18 NOTE — BH Assessment (Signed)
Patient has been accepted to [facility].  Patient assigned to [facility] Accepting physician is Dr. Sofie Hartigan.  Call report to [phone].  Representative was [employer].   ER Staff is aware of it:  Misty Stanley, ER Herbert Spires, Patient's Nurse      Address: [address]  [place], [place] [address]  Bed available today (10/18/2022) but the sheriff department can not transport him until tomorrow (10/19/2022).

## 2022-10-18 NOTE — Consult Note (Signed)
Pt seen by psychiatry and referred for inpatient admission.  Progress note pending.

## 2022-10-18 NOTE — Consult Note (Addendum)
Telepsych Consultation   Reason for Consult:  Psychiatric Consult for Overdose Referring Physician:  ED Provider Location of Patient:    [facility] Location of Provider: Other: Virtual home office  Patient Identification: Ryan Chen MRN:  [id number] Principal Diagnosis: Overdose Diagnosis:  Principal Problem:   Overdose Active Problems:   Polysubstance abuse (HCC)   Intellectual disability   Overdose, undetermined intent, initial encounter   Acute toxic-metabolic encephalopathy   Elevated troponin   Total Time spent with patient: 1 hour  Subjective:   Ryan Chen is a 22 y.o. male patient admitted with  Per RN Triage Note dated 10/16/2022: "Pt arrived via [facility] from home with reports of drug overdose. Pt admits to taking about 350mg  Trazodone and unknown MG of oxycodone, pt reported he was given 1 blue pill that was supposed to be oxycodone, pt states he was "trying to go to sleep" denies any SI/HI.   Pt is alert, frequently drowsy.    HPI:  Patient arrived via EMS for suspected drug overdose.  He was initially admitted to the medical unit with acute toxic metabolic encephalopathy caused by overdose.  Since then, he's been medically cleared and referred to psychiatry for evaluation.    Patient seen via telepsych by this provider; chart reviewed and consulted with Dr. Marval Regal on 10/17/2022.  On evaluation Ryan Chen is seen standing in the exam room.  He is hyperactive and observed frequently changing his position from sitting to standing and then walks around the room.  He is fairly groomed and wearing hospital scrubs. He appears tired.  Pt greeted and anticipatory guidance provided.  He agrees with the assessment.  He is alert to self and location, has some difficulty providing today's date but uses environmental clues, observed looking at the camera for today's date.    When asked about events that led to his current hospitalization, he states he did not take trazodone.   States he "overdosed on fake oxycodone" that was given to him from a drug dealer on there street. He states he took the medication to get high then states he took it to get some sleep.  Patient offers inconsistent information regarding drug use.  Reports he was recently discharged from inpatient facility [facility] on 10/09/2022, where her spend 2 weeks there in rehab from crack cocaine.  Pt states at discharge, the local drug dealer send him a text message alerting him he was close by.  Pt reports this was a trigger and led to reusing.   He reports hx for IDD, Bipolar and ADHD.  Reports dx of IDD but does not appear limited today.  Pt appropriately interactive and able to answer questions.  He reports he receives care at [facility] in [place], [place], followed by Dr. Betti Cruz, and his last visit was one week prior.  He reports taking the following meds: Buspar 15mg  po TID for anxiety (last dose 10/17/2022) Trazodone 150mg  po at bedtime for sleep (last dose 10/17/22) Abilify Maintena 400mg  LAI: receives monthly- last received one week prior by Dr Reddy's office.   He reports working at [employer] in [place], he organizes tool and gets car parts.  He's been there for 3 years and endorses job satisfaction.  He lives with his mother and dad, and states they are aware he is in the emergency department. Pt gives permission to talk with his parents for collateral.    Labs;  CMP within normal limits except for elevated BS at 123mg /dl but  pt was not fasting;  CBC- no leucocytosis UDS is pending EKG -368/417 NSR No medical contribution to stated symptoms observed; pt was already medically cleared at the time of psych assessment.   Spoke with patient patient's mother Ryan Chen @[phone].  She reports patient has a hx for substance abuse problems, collaborates he was recently dx from substance treatment facility.  Since being hospitalized, she she spoke with the patient who told her he was not trying to  commit suicide; however she states she is unsure of is intent because he is struggling with substance abuse and is having a difficult time achieving and maintaining sobriety.  She states the patient lives with her and her husband.  She states pt has never had problems with accidentally taking too much prescribed medication, leading her to question his intent with taking trazodone and oxycodone.   They adopted patient, but when he turned 14, he found his biological family and they introduced him to drugs. She reports he comes from a family hx of polysubstance use.  She states patient currently has felony charges for stealing firearms from her home. She collaborates he is enrolled at [facility] x 5 months for mental health care.  She states he has PTSD, IDD and receives social money that he's been using to buy drugs.  She recently became "the responsible party for his money." She does not have POA as pt would not consent to it.    During evaluation Ryan Chen is seated on the hospital gurney; he is alert/oriented x 4; appears anxious but otherwise is cooperative; and mood congruent with affect.  Patient is speaking in a clear tone at moderate volume, and normal pace; with good eye contact.  His thought process is coherent and relevant; There is no indication that he is currently responding to internal/external stimuli or experiencing delusional thought content.  Patient denies suicidal but appears he's minimizing.  He denies homicidal ideation, psychosis, and paranoia.  Patient has remained cooperative throughout assessment and has answered questions appropriately.    Per ED  Overdose     HPI   Level V caveat: Limited by somnolence   Ryan Chen is a 22 y.o. male brought to the ED via EMS from home with a chief complaint of overdose.  Patient with a history of ADHD, PTSD, intellectual disability who admits to taking about 350 mg Trazodone, and unknown amount of oxycodone as well as Abilify.   Recent discharge from rehab.  States he was "trying to go to sleep".  Denies active SI/HI/AH/VH.  Voices no medical complaints.  Past Psychiatric History: IDD, Bipolar, polysubstance abuse  Risk to Self:  per collateral received from his mother pt appears to be at risk for self harm. Risk to Others: deni Prior Inpatient Therapy:   Prior Outpatient Therapy:    Past Medical History:  Past Medical History:  Diagnosis Date   ADHD (attention deficit hyperactivity disorder)    Depression    Headache(784.0)    Intellectual disability    PTSD (post-traumatic stress disorder)     Past Surgical History:  Procedure Laterality Date   CIRCUMCISION     TONSILLECTOMY Bilateral 2010   Family History:  Family History  Adopted: Yes  Problem Relation Age of Onset   Other Other        Fhx Heart Problems on Bio Father's Side   Family Psychiatric  History: pt was adopted; has family hx for polysubstance abuse. Social History:  Social History   Substance  and Sexual Activity  Alcohol Use Not Currently     Social History   Substance and Sexual Activity  Drug Use Yes   Types: Cocaine    Social History   Socioeconomic History   Marital status: Single    Spouse name: Not on file   Number of children: Not on file   Years of education: Not on file   Highest education level: Not on file  Occupational History   Not on file  Tobacco Use   Smoking status: Former    Types: Cigarettes   Smokeless tobacco: Never  Vaping Use   Vaping status: Every Day  Substance and Sexual Activity   Alcohol use: Not Currently   Drug use: Yes    Types: Cocaine   Sexual activity: Never  Other Topics Concern   Not on file  Social History Narrative   Not on file   Social Determinants of Health   Financial Resource Strain: Not on file  Food Insecurity: No Food Insecurity (10/17/2022)   Hunger Vital Sign    Worried About Running Out of Food in the Last Year: Never true    Ran Out of Food in the Last  Year: Never true  Transportation Needs: No Transportation Needs (10/17/2022)   PRAPARE - Administrator, Civil Service (Medical): No    Lack of Transportation (Non-Medical): No  Physical Activity: Not on file  Stress: Not on file  Social Connections: Not on file   Additional Social History:    Allergies:   Allergies  Allergen Reactions   Soy Allergy Swelling   Amoxicillin Nausea And Vomiting    Ineffective .Marland KitchenHas patient had a PCN reaction causing immediate rash, facial/tongue/throat swelling, SOB or lightheadedness with hypotension: No Has patient had a PCN reaction causing severe rash involving mucus membranes or skin necrosis: No Has patient had a PCN reaction that required hospitalization No Has patient had a PCN reaction occurring within the last 10 years: Yes If all of the above answers are "NO", then may proceed with Cephalosporin use.     Labs:  Results for orders placed or performed during the hospital encounter of 10/16/22 (from the past 48 hour(s))  CBG monitoring, ED     Status: Abnormal   Collection Time: 10/16/22 11:25 PM  Result Value Ref Range   Glucose-Capillary 107 (H) 70 - 99 mg/dL    Comment: Glucose reference range applies only to samples taken after fasting for at least 8 hours.  CBC     Status: None   Collection Time: 10/16/22 11:26 PM  Result Value Ref Range   WBC 7.5 4.0 - 10.5 K/uL   RBC 4.69 4.22 - 5.81 MIL/uL   Hemoglobin 14.1 13.0 - 17.0 g/dL   HCT 65.7 84.6 - 96.2 %   MCV 91.7 80.0 - 100.0 fL   MCH 30.1 26.0 - 34.0 pg   MCHC 32.8 30.0 - 36.0 g/dL   RDW 95.2 84.1 - 32.4 %   Platelets 158 150 - 400 K/uL   nRBC 0.0 0.0 - 0.2 %    Comment: Performed at [facility], [address], [place], [place] [address]  Comprehensive metabolic panel     Status: Abnormal   Collection Time: 10/16/22 11:26 PM  Result Value Ref Range   Sodium 139 135 - 145 mmol/L   Potassium 3.6 3.5 - 5.1 mmol/L   Chloride 110 98 - 111 mmol/L   CO2 23  22 - 32 mmol/L   Glucose,  Bld 123 (H) 70 - 99 mg/dL    Comment: Glucose reference range applies only to samples taken after fasting for at least 8 hours.   BUN 16 6 - 20 mg/dL   Creatinine, Ser 3.24 0.61 - 1.24 mg/dL   Calcium 9.1 8.9 - 40.1 mg/dL   Total Protein 6.3 (L) 6.5 - 8.1 g/dL   Albumin 3.7 3.5 - 5.0 g/dL   AST 25 15 - 41 U/L   ALT 24 0 - 44 U/L   Alkaline Phosphatase 47 38 - 126 U/L   Total Bilirubin 0.3 0.3 - 1.2 mg/dL   GFR, Estimated >02 >72 mL/min    Comment: (NOTE) Calculated using the CKD-EPI Creatinine Equation (2021)    Anion gap 6 5 - 15    Comment: Performed at [facility], [address] [place], [place] [address]  Acetaminophen level     Status: Abnormal   Collection Time: 10/16/22 11:26 PM  Result Value Ref Range   Acetaminophen (Tylenol), Serum <10 (L) 10 - 30 ug/mL    Comment: (NOTE) Therapeutic concentrations vary significantly. A range of 10-30 ug/mL  may be an effective concentration for many patients. However, some  are best treated at concentrations outside of this range. Acetaminophen concentrations >150 ug/mL at 4 hours after ingestion  and >50 ug/mL at 12 hours after ingestion are often associated with  toxic reactions.  Performed at [facility], [address] [place], [place] [address]   Ethanol     Status: None   Collection Time: 10/16/22 11:26 PM  Result Value Ref Range   Alcohol, Ethyl (B) <10 <10 mg/dL    Comment: (NOTE) Lowest detectable limit for serum alcohol is 10 mg/dL.  For medical purposes only. Performed at [facility], [address] [place], [place] [address]   Lipase, blood     Status: None   Collection Time: 10/16/22 11:26 PM  Result Value Ref Range   Lipase 27 11 - 51 U/L    Comment: Performed at [facility], [address] [place], [place] [address]  Salicylate level     Status: Abnormal   Collection Time: 10/16/22 11:26 PM  Result Value Ref Range   Salicylate Lvl  <7.0 (L) 7.0 - 30.0 mg/dL    Comment: Performed at [facility], [address] [place], [place] [address]  Troponin I (High Sensitivity)     Status: Abnormal   Collection Time: 10/16/22 11:26 PM  Result Value Ref Range   Troponin I (High Sensitivity) 19 (H) <18 ng/L    Comment: (NOTE) Elevated high sensitivity troponin I (hsTnI) values and significant  changes across serial measurements may suggest ACS but many other  chronic and acute conditions are known to elevate hsTnI results.  Refer to the "Links" section for chest pain algorithms and additional  guidance. Performed at [facility], [address] [place], [place] [address]   Troponin I (High Sensitivity)     Status: Abnormal   Collection Time: 10/17/22  1:27 AM  Result Value Ref Range   Troponin I (High Sensitivity) 54 (H) <18 ng/L    Comment: READ BACK AND VERIFIED WITH  LOU MERRICKS@0305  10/17/22 RH (NOTE) Elevated high sensitivity troponin I (hsTnI) values and significant  changes across serial measurements may suggest ACS but many other  chronic and acute conditions are known to elevate hsTnI results.  Refer to the "Links" section for chest pain algorithms and additional  guidance. Performed at [facility], [address] [place], [place] [address]   HIV Antibody (routine testing w rflx)     Status: None   Collection Time: 10/17/22  6:05 AM  Result Value Ref Range   HIV Screen 4th Generation wRfx Non Reactive Non Reactive    Comment: Performed at [facility], [address], [place], [place] [address]    Medications:  Current Facility-Administered Medications  Medication Dose Route Frequency Provider Last Rate Last Admin   acetaminophen (TYLENOL) tablet 650 mg  650 mg Oral Q6H PRN Andris Baumann, MD   650 mg at 10/18/22 0981   Or   acetaminophen (TYLENOL) suppository 650 mg  650 mg Rectal Q6H PRN Andris Baumann, MD       ARIPiprazole (ABILIFY) tablet 15 mg  15 mg Oral  Daily Marrion Coy, MD   15 mg at 10/18/22 0954   busPIRone (BUSPAR) tablet 15 mg  15 mg Oral TID Marrion Coy, MD   15 mg at 10/18/22 0954   escitalopram (LEXAPRO) tablet 10 mg  10 mg Oral Daily Marrion Coy, MD   10 mg at 10/18/22 0955   methadone (DOLOPHINE) tablet 60 mg  60 mg Oral Daily Marrion Coy, MD   60 mg at 10/18/22 0954   ondansetron (ZOFRAN) tablet 4 mg  4 mg Oral Q6H PRN Andris Baumann, MD       Or   ondansetron [facility]) injection 4 mg  4 mg Intravenous Q6H PRN Andris Baumann, MD       Current Outpatient Medications  Medication Sig Dispense Refill   ABILIFY MAINTENA 400 MG SRER injection Inject 400 mg into the muscle every 28 (twenty-eight) days.     ARIPiprazole (ABILIFY) 15 MG tablet Take 1 tablet (15 mg total) by mouth daily. 30 tablet 2   busPIRone (BUSPAR) 15 MG tablet Take 15 mg by mouth 3 (three) times daily.     cetirizine (ZYRTEC) 10 MG tablet Take 10 mg by mouth daily.     escitalopram (LEXAPRO) 10 MG tablet Take 1 tablet (10 mg total) by mouth daily. 30 tablet 2   naloxone (NARCAN) nasal spray 4 mg/0.1 mL sign 2 each 0   traZODone (DESYREL) 150 MG tablet Take 150 mg by mouth at bedtime.      Musculoskeletal: pt moves all extremities and ambulates independently.  Strength & Muscle Tone: within normal limits Gait & Station: normal Patient leans: N/A   Psychiatric Specialty Exam:  Presentation  General Appearance:  Fairly Groomed  Eye Contact: Good  Speech: Normal Rate  Speech Volume: Normal  Handedness: Right   Mood and Affect  Mood: Anxious  Affect: Congruent; Blunt   Thought Process  Thought Processes: Goal Directed  Descriptions of Associations:Intact  Orientation:Full (Time, Place and Person)  Thought Content:Illogical  History of Schizophrenia/Schizoaffective disorder:No  Duration of Psychotic Symptoms:No data recorded Hallucinations:Hallucinations: None  Ideas of Reference:None  Suicidal Thoughts:Suicidal Thoughts:  No (pt denies but considering collateral from his mom, he appears to minimize)  Homicidal Thoughts:Homicidal Thoughts: No   Sensorium  Memory: Immediate Good  Judgment: Poor  Insight: Poor   Executive Functions  Concentration: Poor  Attention Span: Poor  Recall: Fair  Fund of Knowledge: Fair  Language: Good   Psychomotor Activity  Psychomotor Activity:Psychomotor Activity: Increased (pt frequently changing positions, states this is his baseline)   Assets  Assets: Communication Skills; Desire for Improvement; Social Support; Health and safety inspector; Housing   Sleep  Sleep:Sleep: Fair Number of Hours of  Sleep: 4    Physical Exam: Physical Exam Cardiovascular:     Rate and Rhythm: Normal rate.     Pulses: Normal pulses.  Pulmonary:     Effort: Pulmonary effort is normal.  Musculoskeletal:        General: Normal range of motion.     Cervical back: Normal range of motion.  Neurological:     Mental Status: He is alert and oriented to person, place, and time. Mental status is at baseline.  Psychiatric:        Attention and Perception: Attention and perception normal.        Mood and Affect: Mood is anxious. Affect is blunt.        Speech: Speech normal.        Behavior: Behavior is hyperactive. Behavior is cooperative.        Thought Content: Thought content does not include suicidal ideation. Thought content does not include suicidal plan.        Cognition and Memory: Cognition and memory normal.        Judgment: Judgment is impulsive.    Review of Systems  Constitutional: Negative.   HENT: Negative.    Eyes: Negative.   Respiratory: Negative.    Cardiovascular: Negative.   Gastrointestinal: Negative.   Genitourinary: Negative.   Musculoskeletal: Negative.   Skin: Negative.   Neurological: Negative.   Endo/Heme/Allergies: Negative.   Psychiatric/Behavioral:  Positive for substance abuse. The patient is nervous/anxious.    Blood  pressure (!) 142/89, pulse 64, temperature 98.2 F (36.8 C), temperature source Oral, resp. rate 18, height 6' (1.829 m), weight 128.2 kg, SpO2 98%. Body mass index is 38.33 kg/m.  Treatment Plan Summary: Pt wit IDD, Bipolar disorder, polysubstance abuse, presents to the hospital via EMS with concerns of overdosing on trazodone and oxycodone. On assessment today, pt provide inconsistent information regarding his intent. Based on collateral received from is mother, he appears to minimize his concerns, is impulsive and difficulty making good decisions.   Given his presentation, will refer him for inpatient admission.  Recommend restarting his home medications.   Daily contact with patient to assess and evaluate symptoms and progress in treatment and Medication management  Disposition: Recommend psychiatric Inpatient admission when medically cleared.  This service was provided via telemedicine using a 2-way, interactive audio and video technology.  Spoke with Dr. Chipper Herb, Thurston Hole, NP; Robinette Haines, TTS counselor were all informed of above recommendation and disposition at 08:30am via secure chat.   Names of all persons participating in this telemedicine service and their role in this encounter. Name: Ryan Chen Role: Patient  Name: Romie Jumper Role: Patient's Mother  Name: Ophelia Shoulder Role: PMHNP  Name: Lewanda Rife Role: Psychiatrist    Chales Abrahams, NP 10/18/2022 1:18 PM

## 2022-10-18 NOTE — ED Notes (Signed)
Pt given dinner tray and beverage  

## 2022-10-18 NOTE — Discharge Summary (Signed)
Physician Discharge Summary   Patient: Ryan Chen MRN: [id number] DOB: [birth year]  Admit date:     10/16/2022  Discharge date: 10/18/22  Discharge Physician: Marrion Coy   PCP: Jerl Mina, MD   Recommendations at discharge:    Follow with PCP in 2 weeks  Discharge Diagnoses: Principal Problem:   Overdose Active Problems:   Overdose, undetermined intent, initial encounter   Acute toxic-metabolic encephalopathy   Elevated troponin   Bipolar disorder, current episode depressed, severe (HCC)   Polysubstance abuse (HCC)   Intellectual disability   Obesity (BMI 30-39.9)  Resolved Problems:   * No resolved hospital problems. *  Hospital Course:  Ryan Chen is a 22 y.o. male with medical history significant for Intellectual disability, substance abuse, mood disorder who arrived by EMS for about an overdose.  Patient took trazodone 350 mg, unknown amount of Abilify and unknown amount of oxycodone which she states he took to try to go to sleep.  Patient was monitored overnight, he was seen by psychiatry, recommended transfer to psychiatry for treatment.  Today pending bed assignment. Patient has a bed available tonight, will transfer  Assessment and Plan: Overdose, undetermined intent, initial encounter Acute toxic metabolic encephalopathy secondary to overdose Patient condition has improved, medically stable for discharge. Will transfer to psychiatry today     Elevated troponin Troponin bump from 19-54 Likely demand ischemia.  Low suspicion for ACS No evidence of ACS.   Intellectual disability Mood disorder-ADHD History of polysubstance abuse Resume home medicine except holding trazodone. Discussed with pharmacy, patient was also taking higher dose of methadone, which has been confirmed with outpatient pharmacy.  Restarted.   Obesity with BMI 38.33. Diet and excise.         Consultants: Psychiatry Procedures performed: None  Disposition:  Psych  unit Diet recommendation:  Discharge Diet Orders (From admission, onward)     Start     Ordered   10/18/22 0000  Diet - low sodium heart healthy        10/18/22 1822           Cardiac diet DISCHARGE MEDICATION: Allergies as of 10/18/2022       Reactions   Soy Allergy Swelling   Amoxicillin Nausea And Vomiting   Ineffective .Marland KitchenHas patient had a PCN reaction causing immediate rash, facial/tongue/throat swelling, SOB or lightheadedness with hypotension: No Has patient had a PCN reaction causing severe rash involving mucus membranes or skin necrosis: No Has patient had a PCN reaction that required hospitalization No Has patient had a PCN reaction occurring within the last 10 years: Yes If all of the above answers are "NO", then may proceed with Cephalosporin use.        Medication List     TAKE these medications    ARIPiprazole 15 MG tablet Commonly known as: Abilify Take 1 tablet (15 mg total) by mouth daily.   Abilify Maintena 400 MG Srer injection Generic drug: ARIPiprazole ER Inject 400 mg into the muscle every 28 (twenty-eight) days.   busPIRone 15 MG tablet Commonly known as: BUSPAR Take 15 mg by mouth 3 (three) times daily.   cetirizine 10 MG tablet Commonly known as: ZYRTEC Take 10 mg by mouth daily.   escitalopram 10 MG tablet Commonly known as: Lexapro Take 1 tablet (10 mg total) by mouth daily.   methadone 10 MG/ML solution Commonly known as: DOLOPHINE Take 60 mg by mouth daily.   naloxone 4 MG/0.1ML Liqd nasal spray kit Commonly known as: [employer]  sign   traZODone 150 MG tablet Commonly known as: DESYREL Take 150 mg by mouth at bedtime.        Follow-up Information     Jerl Mina, MD Follow up in 2 week(s).   Specialty: Family Medicine Contact information: [address] [place] [address] [phone]                Discharge Exam: Ceasar Mons Weights   10/16/22 2327  Weight: 128.2 kg   General exam: Appears calm and  comfortable  Respiratory system: Clear to auscultation. Respiratory effort normal. Cardiovascular system: S1 & S2 heard, RRR. No JVD, murmurs, rubs, gallops or clicks. No pedal edema. Gastrointestinal system: Abdomen is nondistended, soft and nontender. No organomegaly or masses felt. Normal bowel sounds heard. Central nervous system: Alert and oriented. No focal neurological deficits. Extremities: Symmetric 5 x 5 power. Skin: No rashes, lesions or ulcers Psychiatry: Judgement and insight appear normal. Mood & affect appropriate.    Condition at discharge: good  The results of significant diagnostics from this hospitalization (including imaging, microbiology, ancillary and laboratory) are listed below for reference.   Imaging Studies: DG Chest Port 1 View  Result Date: 10/16/2022 CLINICAL DATA:  Overdose. EXAM: PORTABLE CHEST 1 VIEW COMPARISON:  05/20/2019 FINDINGS: Patient's chin obscures the apices. Overall low lung volumes. Mild bibasilar atelectasis. The heart is grossly normal in size. No pneumothorax or large pleural effusion. IMPRESSION: Low lung volumes with bibasilar atelectasis. Electronically Signed   By: Narda Rutherford M.D.   On: 10/16/2022 23:49   CT Head Wo Contrast  Result Date: 10/16/2022 CLINICAL DATA:  Mental status change, unknown cause EXAM: CT HEAD WITHOUT CONTRAST TECHNIQUE: Contiguous axial images were obtained from the base of the skull through the vertex without intravenous contrast. RADIATION DOSE REDUCTION: This exam was performed according to the departmental dose-optimization program which includes automated exposure control, adjustment of the mA and/or kV according to patient size and/or use of iterative reconstruction technique. COMPARISON:  Head CT 06/11/2023 FINDINGS: Brain: No intracranial hemorrhage, mass effect, or midline shift. No hydrocephalus. The basilar cisterns are patent. No evidence of territorial infarct or acute ischemia. No extra-axial or  intracranial fluid collection. Vascular: No hyperdense vessel or unexpected calcification. Skull: No fracture or focal lesion. Sinuses/Orbits: Paranasal sinus mucosal thickening is similar to prior exam. No sinus fluid levels. No mastoid effusion. No acute orbital findings. Other: None. IMPRESSION: 1. No acute intracranial abnormality. 2. Unchanged paranasal sinus mucosal thickening. Electronically Signed   By: Narda Rutherford M.D.   On: 10/16/2022 23:41    Microbiology: Results for orders placed or performed in visit on 01/20/19  Novel Coronavirus, NAA ([facility])     Status: None   Collection Time: 01/20/19 12:00 AM   Specimen: Nasopharyngeal(NP) swabs in vial transport medium   NASOPHARYNGE  TESTING  Result Value Ref Range Status   SARS-CoV-2, NAA Not Detected Not Detected Final    Comment: This nucleic acid amplification test was developed and its performance characteristics determined by [employer]. Nucleic acid amplification tests include PCR and TMA. This test has not been FDA cleared or approved. This test has been authorized by FDA under an Emergency Use Authorization (EUA). This test is only authorized for the duration of time the declaration that circumstances exist justifying the authorization of the emergency use of in vitro diagnostic tests for detection of SARS-CoV-2 virus and/or diagnosis of COVID-19 infection under section 564(b)(1) of the Act, 21 U.S.C. 213YQM-5(H) (1), unless the authorization is terminated  or revoked sooner. When diagnostic testing is negative, the possibility of a false negative result should be considered in the context of a patient's recent exposures and the presence of clinical signs and symptoms consistent with COVID-19. An individual without symptoms of COVID-19 and who is not shedding SARS-CoV-2 virus would  expect to have a negative (not detected) result in this assay.     Labs: CBC: Recent Labs  Lab 10/16/22 2326  WBC 7.5  HGB  14.1  HCT 43.0  MCV 91.7  PLT 158   Basic Metabolic Panel: Recent Labs  Lab 10/16/22 2326  NA 139  K 3.6  CL 110  CO2 23  GLUCOSE 123*  BUN 16  CREATININE 1.08  CALCIUM 9.1   Liver Function Tests: Recent Labs  Lab 10/16/22 2326  AST 25  ALT 24  ALKPHOS 47  BILITOT 0.3  PROT 6.3*  ALBUMIN 3.7   CBG: Recent Labs  Lab 10/16/22 2325  GLUCAP 107*    Discharge time spent: greater than 30 minutes.  Signed: Marrion Coy, MD [facility] 10/18/2022

## 2022-10-18 NOTE — ED Provider Notes (Signed)
-----------------------------------------   6:16 PM on 10/18/2022 ----------------------------------------- I reviewed the patient's chart including the note from Dr. Chipper Herb.  It appears that the patient has been cleared medically and is now awaiting psychiatric placement.  We were able to obtain psychiatric placement at [facility].  Patient will be transferred shortly for further psychiatric treatment.   Minna Antis, MD 10/18/22 1816

## 2022-10-18 NOTE — Progress Notes (Signed)
  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  Patient took trazodone 350 mg, unknown amount of Abilify and unknown amount of oxycodone which she states he took to try to go to sleep.  Patient was monitored overnight, he was seen by psychiatry, recommended transfer to psychiatry for treatment.  Today pending bed assignment.    Principal Problem:   Overdose Active Problems:   Overdose, undetermined intent, initial encounter   Acute toxic-metabolic encephalopathy   Elevated troponin   Bipolar disorder, current episode depressed, severe (HCC)   Polysubstance abuse (HCC)   Intellectual disability   Obesity (BMI 30-39.9)   Assessment and Plan:  Overdose, undetermined intent, initial encounter Acute toxic metabolic encephalopathy secondary to overdose Patient condition has improved, medically stable for discharge. Currently pending bed assignment for psychiatry transfer.     Elevated troponin Troponin bump from 19-54 Likely demand ischemia.  Low suspicion for ACS No evidence of ACS.   Intellectual disability Mood disorder-ADHD History of polysubstance abuse Resume home medicine except holding trazodone. Discussed with pharmacy, patient was also taking higher dose of methadone, which has been confirmed with outpatient pharmacy.  Restarted.   Obesity with BMI 38.33. Diet and excise.      Subjective:  Patient has no complaint today.  Physical Exam: Vitals:   10/18/22 0550 10/18/22 0551 10/18/22 0730 10/18/22 1015  BP: (!) 144/88  133/70 (!) 142/89  Pulse: 91   64  Resp: 17   18  Temp: 97.9 F (36.6 C)   98.2 F (36.8 C)  TempSrc: Oral   Oral  SpO2: 100% 100%  98%   Weight:      Height:       General exam: Appears calm and comfortable  Respiratory system: Clear to auscultation. Respiratory effort normal. Cardiovascular system: S1 & S2 heard, RRR. No JVD, murmurs, rubs, gallops or clicks. No pedal edema. Gastrointestinal system: Abdomen is nondistended, soft and nontender. No organomegaly or masses felt. Normal bowel sounds heard. Central nervous system: Alert and oriented. No focal neurological deficits. Extremities: Symmetric 5 x 5 power. Skin: No rashes, lesions or ulcers Psychiatry: Judgement and insight appear normal. Mood & affect appropriate.    Data Reviewed:  There are no new results to review at this time.  Family Communication: None  Disposition: Status is: Observation      Time spent: 35 minutes  Author: Marrion Coy, MD 10/18/2022 4:16 PM  For on call review [email or web address].

## 2022-10-18 NOTE — ED Notes (Signed)
Pt and mother inquiring if pt's home trazodone was ordered. RN confirmed dose that pt and mother both agree he takes, 150mg  PO at night. Dr. Chipper Herb informed of request, verbal orders received.

## 2022-10-18 NOTE — ED Notes (Deleted)
[facility]  GOING TO  [facility]  ON 10/19/22

## 2022-10-18 NOTE — ED Notes (Addendum)
IVC/PENDING PSYCH CONSULT 

## 2022-10-18 NOTE — ED Notes (Signed)
IVC/  PENDING  PLACEMENT 

## 2022-10-18 NOTE — ED Notes (Signed)
Per [facility], transportation available today. Dr. Chipper Herb notified, awaiting discharge orders.

## 2022-10-18 NOTE — ED Notes (Addendum)
Mother, Angelia updated on pt disposition per patient request.

## 2022-10-18 NOTE — ED Notes (Signed)
Pt moved from room 26 to 19H. Pt visible to staff at all times while in his stretcher. 1:1 order d/c per secure chat with Dr. Chipper Herb, MD

## 2022-10-18 NOTE — ED Notes (Signed)
[place]  [facility]  CALLED TO  TRANSPORT  PT  TO  [facility]

## 2022-10-18 NOTE — BH Assessment (Signed)
Per [facility] Sammuel Bailiff S), patient to be referred out of system.  Referral information for Psychiatric Hospitalization faxed to;   [facility] [phone]- [phone]), no appropriate bed.  [facility] [phone]),   [address] [phone]),  [place] [phone], [phone], [phone] or [phone]),   [place] [phone]--- [phone]--- [phone]--- [phone])  [address] [phone]),   [facility] [phone] -or- [phone]),   [facility] [phone]),  [place] [phone])  [facility] [phone]).  [facility] [phone])

## 2022-10-18 NOTE — ED Notes (Signed)
Discharge orders placed by Dr. Chipper Herb. Pt transferred to [facility] via [facility]. Ambulatory. Transport sent with 1 bag of belongings. Charge RN Angela aware.

## 2022-10-18 NOTE — Progress Notes (Signed)
I examined patient today, he is medically stable for psych transfer.

## 2022-11-18 ENCOUNTER — Encounter: Payer: Self-pay | Admitting: Emergency Medicine

## 2022-11-18 ENCOUNTER — Other Ambulatory Visit: Payer: Self-pay

## 2022-11-18 ENCOUNTER — Emergency Department
Admission: EM | Admit: 2022-11-18 | Discharge: 2022-11-18 | Disposition: A | Discharge status: Home / Self Care | Payer: MEDICAID | Attending: Emergency Medicine | Admitting: Emergency Medicine

## 2022-11-18 ENCOUNTER — Emergency Department: Payer: MEDICAID

## 2022-11-18 DIAGNOSIS — R1110 Vomiting, unspecified: Secondary | ICD-10-CM | POA: Diagnosis present

## 2022-11-18 LAB — URINALYSIS, ROUTINE W REFLEX MICROSCOPIC
Bilirubin Urine: NEGATIVE
Glucose, UA: NEGATIVE mg/dL
Hgb urine dipstick: NEGATIVE
Ketones, ur: NEGATIVE mg/dL
Leukocytes,Ua: NEGATIVE
Nitrite: NEGATIVE
Protein, ur: NEGATIVE mg/dL
Specific Gravity, Urine: 1.03 (ref 1.005–1.030)
pH: 5 (ref 5.0–8.0)

## 2022-11-18 LAB — URINE DRUG SCREEN, QUALITATIVE (ARMC ONLY)
Amphetamines, Ur Screen: NOT DETECTED
Barbiturates, Ur Screen: NOT DETECTED
Benzodiazepine, Ur Scrn: NOT DETECTED
Cannabinoid 50 Ng, Ur ~~LOC~~: POSITIVE — AB
Cocaine Metabolite,Ur ~~LOC~~: POSITIVE — AB
MDMA (Ecstasy)Ur Screen: NOT DETECTED
Methadone Scn, Ur: POSITIVE — AB
Opiate, Ur Screen: NOT DETECTED
Phencyclidine (PCP) Ur S: NOT DETECTED
Tricyclic, Ur Screen: NOT DETECTED

## 2022-11-18 LAB — COMPREHENSIVE METABOLIC PANEL
ALT: 17 U/L (ref 0–44)
AST: 20 U/L (ref 15–41)
Albumin: 4.1 g/dL (ref 3.5–5.0)
Alkaline Phosphatase: 49 U/L (ref 38–126)
Anion gap: 8 (ref 5–15)
BUN: 23 mg/dL — ABNORMAL HIGH (ref 6–20)
CO2: 20 mmol/L — ABNORMAL LOW (ref 22–32)
Calcium: 9.1 mg/dL (ref 8.9–10.3)
Chloride: 111 mmol/L (ref 98–111)
Creatinine, Ser: 0.73 mg/dL (ref 0.61–1.24)
GFR, Estimated: >60 mL/min (ref >60)
Glucose, Bld: 100 mg/dL — ABNORMAL HIGH (ref 70–99)
Potassium: 3.8 mmol/L (ref 3.5–5.1)
Sodium: 139 mmol/L (ref 135–145)
Total Bilirubin: 0.5 mg/dL (ref 0.3–1.2)
Total Protein: 6.9 g/dL (ref 6.5–8.1)

## 2022-11-18 LAB — CBC
HCT: 39.5 % (ref 39.0–52.0)
Hemoglobin: 13.2 g/dL (ref 13.0–17.0)
MCH: 30.1 pg (ref 26.0–34.0)
MCHC: 33.4 g/dL (ref 30.0–36.0)
MCV: 90 fL (ref 80.0–100.0)
Platelets: 223 10*3/uL (ref 150–400)
RBC: 4.39 MIL/uL (ref 4.22–5.81)
RDW: 13.7 % (ref 11.5–15.5)
WBC: 6.5 10*3/uL (ref 4.0–10.5)
nRBC: 0 % (ref 0.0–0.2)

## 2022-11-18 LAB — LIPASE, BLOOD: Lipase: 28 U/L (ref 11–51)

## 2022-11-18 MED ORDER — ONDANSETRON 4 MG PO TBDP: 4.0000 mg | ORAL_TABLET | Freq: Four times a day (QID) | ORAL | 0 refills | Status: AC | PRN

## 2022-11-18 NOTE — ED Triage Notes (Signed)
Pt in via POV, reports having some nausea/vomiting over the last week, states this happens after, "I hit my vape."  This RN asked if he had considered not hitting his vape to see if that helped, patient declines.    Mother is with patient, states this has been going on for more than a week.    Ambulatory to triage, NAD noted at this time.

## 2022-11-18 NOTE — Discharge Instructions (Signed)
As we discussed earlier, I recommend you start taking over-the-counter Nexium for the next 2 weeks as this may be caused by acid reflux.  Follow-up closely with Dr. Burnett Sheng  Please return to the emergency room right away if you are to develop a fever, severe nausea, your pain becomes severe or worsens, you are unable to keep food down, begin vomiting any dark or bloody fluid, you develop any dark or bloody stools, feel dehydrated, or other new concerns or symptoms arise.

## 2022-11-18 NOTE — ED Provider Notes (Signed)
[facility] Provider Note    Event Date/Time   First MD Initiated Contact with Patient 11/18/22 1109     (approximate)   History   Emesis   HPI  Ryan Chen is a 22 y.o. male with a history of ADHD depression  For about 2 weeks patient has noticed when he wakes in the morning he will feel nauseated have to vomit.  Otherwise he feels fine.  There is no black or bloody emesis.  He will eat breakfast thereafter go about his day normally and "eats all the time".  Normal bowel movements.  Not having any nausea during the day.  He also reports this seems to be associated with times when he utilizes his nicotine vape pen.  Questions if that might be part of it as he usually takes a puff or 2 from his vape pen each morning but also use it during the day without issue  Has no fevers no abdominal pain.  At the moment he feels perfectly fine.  He comes today because of the persistence of symptoms.     Physical Exam   Triage Vital Signs: ED Triage Vitals  Encounter Vitals Group     BP 11/18/22 1047 (!) 148/84     Systolic BP Percentile --      Diastolic BP Percentile --      Pulse Rate 11/18/22 1047 61     Resp 11/18/22 1047 18     Temp 11/18/22 1047 98.2 F (36.8 C)     Temp Source 11/18/22 1047 Oral     SpO2 11/18/22 1047 97 %     Weight 11/18/22 1048 254 lb (115.2 kg)     Height 11/18/22 1048 6' (1.829 m)     Head Circumference --      Peak Flow --      Pain Score 11/18/22 1048 0     Pain Loc --      Pain Education --      Exclude from Growth Chart --     Most recent vital signs: Vitals:   11/18/22 1047  BP: (!) 148/84  Pulse: 61  Resp: 18  Temp: 98.2 F (36.8 C)  SpO2: 97%   Patient's mother at the bedside  General: Awake, no distress.  CV:  Good peripheral perfusion.  Normal tones and rate Resp:  Normal effort.  Clear bilateral Abd:  No distention.  Soft nontender nondistended throughout Other:  No focal tenderness McBurney's  point.  Negative Murphy   ED Results / Procedures / Treatments   Labs (all labs ordered are listed, but only abnormal results are displayed) Labs Reviewed  COMPREHENSIVE METABOLIC PANEL - Abnormal; Notable for the following components:      Result Value   CO2 20 (*)    Glucose, Bld 100 (*)    BUN 23 (*)    All other components within normal limits  URINALYSIS, ROUTINE W REFLEX MICROSCOPIC - Abnormal; Notable for the following components:   Color, Urine YELLOW (*)    APPearance CLEAR (*)    All other components within normal limits  URINE DRUG SCREEN, QUALITATIVE ([facility] ONLY) - Abnormal; Notable for the following components:   Cocaine Metabolite,Ur [place] POSITIVE (*)    Cannabinoid 50 Ng, Ur [place] POSITIVE (*)    Methadone Scn, Ur POSITIVE (*)    All other components within normal limits  LIPASE, BLOOD  CBC   Drug screen positive for cannabinoid cocaine and methadone.  He  does take methadone.  His mom is also aware of his ongoing cocaine use, and he is currently in focused psychiatric counseling as well as substance abuse program which is aware of his current substance abuse pattern.  Labs reviewed no acute abnormality noted, mildly reduced CO2  EKG     RADIOLOGY  US ABDOMEN LIMITED RUQ (LIVER/GB)  Result Date: 11/18/2022 CLINICAL DATA:  Nausea and right upper quadrant pain. EXAM: ULTRASOUND ABDOMEN LIMITED RIGHT UPPER QUADRANT COMPARISON:  None Available. FINDINGS: Gallbladder: No gallstones or wall thickening visualized. No sonographic Murphy sign noted by sonographer. Common bile duct: Diameter: 27 mm Liver: No focal lesion identified. Within normal limits in parenchymal echogenicity. Portal vein is patent on color Doppler imaging with normal direction of blood flow towards the liver. Other: None. IMPRESSION: Normal right upper quadrant ultrasound. Electronically Signed   By: Ted Mcalpine M.D.   On: 11/18/2022 14:20      PROCEDURES:  Critical Care performed:  No  Procedures   MEDICATIONS ORDERED IN ED: Medications - No data to display   IMPRESSION / MDM / ASSESSMENT AND PLAN / ED COURSE  I reviewed the triage vital signs and the nursing notes.                              Differential diagnosis includes, but is not limited to, reflux, esophagitis, possible relation to use of vape pen but patient does not wish to reduce the use of this nicotine vape pen, gastritis, other cause given the associated vomiting also include cholelithiasis.  No clinical signs or symptoms suggestive of obstruction still having normal bowel movements she still eating and drinking having normal meals despite vomiting in the morning for 2 weeks.  Very reassuring exam nontoxic reassuring workup including CBC afebrile status.  Right upper quadrant ultrasound negative for acute pathology.  Patient ambulatory in the department eating crackers and water without difficulty.  Suggested to patient as well as mother that may wish to trial as needed Zofran as well as trial over-the-counter Nexium for about 2 weeks.  Patient and mother are in agreement with this plan.  Patient will follow close with Dr. Burnett Sheng  Patient's presentation is most consistent with acute complicated illness / injury requiring diagnostic workup.   Return precautions and treatment recommendations and follow-up discussed with the patient who is agreeable with the plan.        FINAL CLINICAL IMPRESSION(S) / ED DIAGNOSES   Final diagnoses:  Morning vomiting     Rx / DC Orders   ED Discharge Orders          Ordered    ondansetron (ZOFRAN-ODT) 4 MG disintegrating tablet  Every 6 hours PRN        11/18/22 1153             Note:  This document was prepared using Dragon voice recognition software and may include unintentional dictation errors.   Sharyn Creamer, MD 11/18/22 [phone]

## 2022-11-18 NOTE — ED Notes (Signed)
Reports last Cocaine use Friday; mother also discloses that he is on Methadone.

## 2022-11-19 NOTE — Group Note (Deleted)

## 2022-11-25 ENCOUNTER — Emergency Department
Admission: EM | Admit: 2022-11-25 | Discharge: 2022-11-26 | Disposition: A | Discharge status: Home / Self Care | Payer: MEDICAID | Attending: Emergency Medicine | Admitting: Emergency Medicine

## 2022-11-25 ENCOUNTER — Other Ambulatory Visit: Payer: Self-pay

## 2022-11-25 DIAGNOSIS — F909 Attention-deficit hyperactivity disorder, unspecified type: Secondary | ICD-10-CM | POA: Diagnosis present

## 2022-11-25 DIAGNOSIS — F419 Anxiety disorder, unspecified: Secondary | ICD-10-CM | POA: Insufficient documentation

## 2022-11-25 DIAGNOSIS — F1994 Other psychoactive substance use, unspecified with psychoactive substance-induced mood disorder: Secondary | ICD-10-CM | POA: Diagnosis present

## 2022-11-25 DIAGNOSIS — F1910 Other psychoactive substance abuse, uncomplicated: Secondary | ICD-10-CM | POA: Diagnosis present

## 2022-11-25 DIAGNOSIS — R4587 Impulsiveness: Secondary | ICD-10-CM | POA: Diagnosis not present

## 2022-11-25 LAB — URINE DRUG SCREEN, QUALITATIVE (ARMC ONLY)
Amphetamines, Ur Screen: NOT DETECTED
Barbiturates, Ur Screen: NOT DETECTED
Benzodiazepine, Ur Scrn: NOT DETECTED
Cannabinoid 50 Ng, Ur ~~LOC~~: POSITIVE — AB
Cocaine Metabolite,Ur ~~LOC~~: POSITIVE — AB
MDMA (Ecstasy)Ur Screen: NOT DETECTED
Methadone Scn, Ur: POSITIVE — AB
Opiate, Ur Screen: NOT DETECTED
Phencyclidine (PCP) Ur S: NOT DETECTED
Tricyclic, Ur Screen: NOT DETECTED

## 2022-11-25 LAB — COMPREHENSIVE METABOLIC PANEL
ALT: 19 U/L (ref 0–44)
AST: 24 U/L (ref 15–41)
Albumin: 4.9 g/dL (ref 3.5–5.0)
Alkaline Phosphatase: 53 U/L (ref 38–126)
Anion gap: 11 (ref 5–15)
BUN: 22 mg/dL — ABNORMAL HIGH (ref 6–20)
CO2: 19 mmol/L — ABNORMAL LOW (ref 22–32)
Calcium: 10.2 mg/dL (ref 8.9–10.3)
Chloride: 108 mmol/L (ref 98–111)
Creatinine, Ser: 0.88 mg/dL (ref 0.61–1.24)
GFR, Estimated: >60 mL/min (ref >60)
Glucose, Bld: 95 mg/dL (ref 70–99)
Potassium: 3.6 mmol/L (ref 3.5–5.1)
Sodium: 138 mmol/L (ref 135–145)
Total Bilirubin: 1.2 mg/dL (ref 0.3–1.2)
Total Protein: 7.9 g/dL (ref 6.5–8.1)

## 2022-11-25 LAB — CBC
HCT: 41.1 % (ref 39.0–52.0)
Hemoglobin: 14.5 g/dL (ref 13.0–17.0)
MCH: 30.1 pg (ref 26.0–34.0)
MCHC: 35.3 g/dL (ref 30.0–36.0)
MCV: 85.3 fL (ref 80.0–100.0)
Platelets: 277 10*3/uL (ref 150–400)
RBC: 4.82 MIL/uL (ref 4.22–5.81)
RDW: 13.7 % (ref 11.5–15.5)
WBC: 9.7 10*3/uL (ref 4.0–10.5)
nRBC: 0 % (ref 0.0–0.2)

## 2022-11-25 LAB — SALICYLATE LEVEL: Salicylate Lvl: <7 mg/dL — ABNORMAL LOW (ref 7.0–30.0)

## 2022-11-25 LAB — ETHANOL: Alcohol, Ethyl (B): <10 mg/dL (ref <10)

## 2022-11-25 LAB — ACETAMINOPHEN LEVEL: Acetaminophen (Tylenol), Serum: <10 ug/mL — ABNORMAL LOW (ref 10–30)

## 2022-11-25 NOTE — ED Provider Notes (Signed)
[facility] Provider Note    Event Date/Time   First MD Initiated Contact with Patient 11/25/22 1720     (approximate)   History   Anxiety/PTSD   HPI  Ryan Chen is a 22 y.o. male who presents to the emergency department today with primary concern for anxiety and PTSD.  He says he has been dealing with these issues for a long time.  He denies ever seeing a psychiatrist or therapist in the past.     Physical Exam   Triage Vital Signs: ED Triage Vitals [11/25/22 1714]  Encounter Vitals Group     BP (!) 118/99     Systolic BP Percentile      Diastolic BP Percentile      Pulse Rate 91     Resp 17     Temp 98 F (36.7 C)     Temp Source Oral     SpO2 98 %     Weight 254 lb (115.2 kg)     Height 6' (1.829 m)     Head Circumference      Peak Flow      Pain Score 0     Pain Loc      Pain Education      Exclude from Growth Chart     Most recent vital signs: Vitals:   11/25/22 1714  BP: (!) 118/99  Pulse: 91  Resp: 17  Temp: 98 F (36.7 C)  SpO2: 98%   General: Awake, alert, calm. CV:  Good peripheral perfusion.  Resp:  Normal effort.  Abd:  No distention.   ED Results / Procedures / Treatments   Labs (all labs ordered are listed, but only abnormal results are displayed) Labs Reviewed  COMPREHENSIVE METABOLIC PANEL - Abnormal; Notable for the following components:      Result Value   CO2 19 (*)    BUN 22 (*)    All other components within normal limits  SALICYLATE LEVEL - Abnormal; Notable for the following components:   Salicylate Lvl <7.0 (*)    All other components within normal limits  ACETAMINOPHEN LEVEL - Abnormal; Notable for the following components:   Acetaminophen (Tylenol), Serum <10 (*)    All other components within normal limits  URINE DRUG SCREEN, QUALITATIVE ([facility] ONLY) - Abnormal; Notable for the following components:   Cocaine Metabolite,Ur [name] POSITIVE (*)    Cannabinoid 50 Ng, Ur [name] POSITIVE (*)     Methadone Scn, Ur POSITIVE (*)    All other components within normal limits  ETHANOL  CBC     EKG  None   RADIOLOGY None   PROCEDURES:  Critical Care performed: No  MEDICATIONS ORDERED IN ED: Medications - No data to display   IMPRESSION / MDM / ASSESSMENT AND PLAN / ED COURSE  I reviewed the triage vital signs and the nursing notes.                              Differential diagnosis includes, but is not limited to, psychiatric illness, drug induced mood disorder.  Patient's presentation is most consistent with acute presentation with potential threat to life or bodily function.   Patient presented to the emergency department today because of concerns for anxiety and PTSD.  On exam patient is calm.  Will have psychiatry evaluate.  The patient has been placed in psychiatric observation due to the need to provide a safe  environment for the patient while obtaining psychiatric consultation and evaluation, as well as ongoing medical and medication management to treat the patient's condition.  The patient has not been placed under full IVC at this time.      FINAL CLINICAL IMPRESSION(S) / ED DIAGNOSES   Final diagnoses:  Anxiety      Note:  This document was prepared using Dragon voice recognition software and may include unintentional dictation errors.    Phineas Semen, MD 11/25/22 1910

## 2022-11-25 NOTE — ED Notes (Signed)
Pt given pm snack and beverage.

## 2022-11-25 NOTE — BH Assessment (Signed)
Waiting to assess. Request sen to nursing staff to set up tele-remote equipment

## 2022-11-25 NOTE — ED Triage Notes (Signed)
Pt mother sts that pt has been looking for getting high and overdosing. Mother sts that the sheriffs office came and picked pt up and took him to [facility] this morning however [facility] didn't do anything for him and than mother picked pt up from there. Mother sts that pt was IVC'd and than [facility] must has receded them. PT is currently on Methadone and has missed all of his appointments this week.

## 2022-11-25 NOTE — ED Notes (Signed)
RN called dietary to ensure they saw new diet order placed

## 2022-11-25 NOTE — Consult Note (Signed)
Telepsych Consultation   Reason for Consult:  Psych evaluation Referring Physician:  Dr. Derrill Kay Location of Patient: [facility] Location of Provider: Behavioral Health TTS Department  Patient Identification: Zien Tosch MRN:  [id number] Principal Diagnosis: Polysubstance abuse [facility]) Diagnosis:  Principal Problem:   Polysubstance abuse (HCC) Active Problems:   Attention deficit hyperactivity disorder   Impulsiveness   Substance induced mood disorder (HCC)   Total Time spent with patient: {Time; 15 min - 8 hours:17441}  Subjective:   Peyson Galiza is a 22 y.o. male patient admitted with ***.  HPI:  ***  Past Psychiatric History: ***  Risk to Self:   Risk to Others:   Prior Inpatient Therapy:   Prior Outpatient Therapy:    Past Medical History:  Past Medical History:  Diagnosis Date   ADHD (attention deficit hyperactivity disorder)    Depression    Headache(784.0)    Intellectual disability    PTSD (post-traumatic stress disorder)     Past Surgical History:  Procedure Laterality Date   CIRCUMCISION     TONSILLECTOMY Bilateral 2010   Family History:  Family History  Adopted: Yes  Problem Relation Age of Onset   Other Other        Fhx Heart Problems on Bio Father's Side   Family Psychiatric  History: *** Social History:  Social History   Substance and Sexual Activity  Alcohol Use Not Currently     Social History   Substance and Sexual Activity  Drug Use Yes   Types: Cocaine   Comment: Last used 11/16/2022    Social History   Socioeconomic History   Marital status: Single    Spouse name: Not on file   Number of children: Not on file   Years of education: Not on file   Highest education level: Not on file  Occupational History   Not on file  Tobacco Use   Smoking status: Some Days    Types: Cigarettes   Smokeless tobacco: Never  Vaping Use   Vaping status: Every Day  Substance and Sexual Activity   Alcohol use: Not Currently   Drug  use: Yes    Types: Cocaine    Comment: Last used 11/16/2022   Sexual activity: Never  Other Topics Concern   Not on file  Social History Narrative   Not on file   Social Determinants of Health   Financial Resource Strain: Not on file  Food Insecurity: No Food Insecurity (10/17/2022)   Hunger Vital Sign    Worried About Running Out of Food in the Last Year: Never true    Ran Out of Food in the Last Year: Never true  Transportation Needs: No Transportation Needs (10/17/2022)   PRAPARE - Administrator, Civil Service (Medical): No    Lack of Transportation (Non-Medical): No  Physical Activity: Not on file  Stress: Not on file  Social Connections: Not on file   Additional Social History:    Allergies:   Allergies  Allergen Reactions   Soy Allergy Swelling   Amoxicillin Nausea And Vomiting    Ineffective .Marland KitchenHas patient had a PCN reaction causing immediate rash, facial/tongue/throat swelling, SOB or lightheadedness with hypotension: No Has patient had a PCN reaction causing severe rash involving mucus membranes or skin necrosis: No Has patient had a PCN reaction that required hospitalization No Has patient had a PCN reaction occurring within the last 10 years: Yes If all of the above answers are "NO", then may proceed with  Cephalosporin use.     Labs:  Results for orders placed or performed during the hospital encounter of 11/25/22 (from the past 48 hour(s))  Comprehensive metabolic panel     Status: Abnormal   Collection Time: 11/25/22  5:16 PM  Result Value Ref Range   Sodium 138 135 - 145 mmol/L   Potassium 3.6 3.5 - 5.1 mmol/L   Chloride 108 98 - 111 mmol/L   CO2 19 (L) 22 - 32 mmol/L   Glucose, Bld 95 70 - 99 mg/dL    Comment: Glucose reference range applies only to samples taken after fasting for at least 8 hours.   BUN 22 (H) 6 - 20 mg/dL   Creatinine, Ser 1.61 0.61 - 1.24 mg/dL   Calcium 09.6 8.9 - 04.5 mg/dL   Total Protein 7.9 6.5 - 8.1 g/dL   Albumin  4.9 3.5 - 5.0 g/dL   AST 24 15 - 41 U/L   ALT 19 0 - 44 U/L   Alkaline Phosphatase 53 38 - 126 U/L   Total Bilirubin 1.2 0.3 - 1.2 mg/dL   GFR, Estimated >40 >98 mL/min    Comment: (NOTE) Calculated using the CKD-EPI Creatinine Equation (2021)    Anion gap 11 5 - 15    Comment: Performed at [facility], [address] [place], [place] [address]  Ethanol     Status: None   Collection Time: 11/25/22  5:16 PM  Result Value Ref Range   Alcohol, Ethyl (B) <10 <10 mg/dL    Comment: (NOTE) Lowest detectable limit for serum alcohol is 10 mg/dL.  For medical purposes only. Performed at [facility], [address] [place], [place] [address]   Salicylate level     Status: Abnormal   Collection Time: 11/25/22  5:16 PM  Result Value Ref Range   Salicylate Lvl <7.0 (L) 7.0 - 30.0 mg/dL    Comment: Performed at [facility], [address] [place], [place] [address]  Acetaminophen level     Status: Abnormal   Collection Time: 11/25/22  5:16 PM  Result Value Ref Range   Acetaminophen (Tylenol), Serum <10 (L) 10 - 30 ug/mL    Comment: (NOTE) Therapeutic concentrations vary significantly. A range of 10-30 ug/mL  may be an effective concentration for many patients. However, some  are best treated at concentrations outside of this range. Acetaminophen concentrations >150 ug/mL at 4 hours after ingestion  and >50 ug/mL at 12 hours after ingestion are often associated with  toxic reactions.  Performed at [facility], [address] [place], [place] [address]   cbc     Status: None   Collection Time: 11/25/22  5:16 PM  Result Value Ref Range   WBC 9.7 4.0 - 10.5 K/uL   RBC 4.82 4.22 - 5.81 MIL/uL   Hemoglobin 14.5 13.0 - 17.0 g/dL   HCT 46.9 62.9 - 52.8 %   MCV 85.3 80.0 - 100.0 fL   MCH 30.1 26.0 - 34.0 pg   MCHC 35.3 30.0 - 36.0 g/dL   RDW 41.3 24.4 - 01.0 %   Platelets 277 150 - 400 K/uL   nRBC 0.0 0.0 - 0.2 %    Comment: Performed at  [facility], [address], [place], [place] [address]  Urine Drug Screen, Qualitative     Status: Abnormal   Collection Time: 11/25/22  5:16 PM  Result Value Ref Range   Tricyclic, Ur Screen NONE DETECTED NONE DETECTED  Amphetamines, Ur Screen NONE DETECTED NONE DETECTED   MDMA (Ecstasy)Ur Screen NONE DETECTED NONE DETECTED   Cocaine Metabolite,Ur [place]ar[place] POSITIVE (A) NONE DETECTED   Opiate, Ur Screen NONE DETECTED NONE DETECTED   Phencyclidine (PCP) Ur S NONE DETECTED NONE DETECTED   Cannabinoid 50 Ng, Ur Morrison POSITIVE (A) NONE DETECTED   Barbiturates, Ur Screen NONE DETECTED NONE DETECTED   Benzodiazepine, Ur Scrn NONE DETECTED NONE DETECTED   Methadone Scn, Ur POSITIVE (A) NONE DETECTED    Comment: (NOTE) Tricyclics + metabolites, urine    Cutoff 1000 ng/mL Amphetamines + metabolites, urine  Cutoff 1000 ng/mL MDMA (Ecstasy), urine              Cutoff 500 ng/mL Cocaine Metabolite, urine          Cutoff 300 ng/mL Opiate + metabolites, urine        Cutoff 300 ng/mL Phencyclidine (PCP), urine         Cutoff 25 ng/mL Cannabinoid, urine                 Cutoff 50 ng/mL Barbiturates + metabolites, urine  Cutoff 200 ng/mL Benzodiazepine, urine              Cutoff 200 ng/mL Methadone, urine                   Cutoff 300 ng/mL  The urine drug screen provides only a preliminary, unconfirmed analytical test result and should not be used for non-medical purposes. Clinical consideration and professional judgment should be applied to any positive drug screen result due to possible interfering substances. A more specific alternate chemical method must be used in order to obtain a confirmed analytical result. Gas chromatography / mass spectrometry (GC/MS) is the preferred confirm atory method. Performed at [facility], [address] [place], [place] [address]     Medications:  No current facility-administered medications for this encounter.   Current Outpatient  Medications  Medication Sig Dispense Refill   ABILIFY MAINTENA 400 MG SRER injection Inject 400 mg into the muscle every 28 (twenty-eight) days.     ARIPiprazole (ABILIFY) 15 MG tablet Take 1 tablet (15 mg total) by mouth daily. 30 tablet 2   busPIRone (BUSPAR) 15 MG tablet Take 15 mg by mouth 3 (three) times daily.     cetirizine (ZYRTEC) 10 MG tablet Take 10 mg by mouth daily.     escitalopram (LEXAPRO) 10 MG tablet Take 1 tablet (10 mg total) by mouth daily. 30 tablet 2   methadone (DOLOPHINE) 10 MG/ML solution Take 60 mg by mouth daily.     naloxone (NARCAN) nasal spray 4 mg/0.1 mL sign 2 each 0   ondansetron (ZOFRAN-ODT) 4 MG disintegrating tablet Take 1 tablet (4 mg total) by mouth every 6 (six) hours as needed for nausea or vomiting. 20 tablet 0   traZODone (DESYREL) 150 MG tablet Take 150 mg by mouth at bedtime.      Musculoskeletal: Strength & Muscle Tone: {desc; muscle tone:32375} Gait & Station: {PE GAIT ED UEAV:40981} Patient leans: {Patient Leans:21022755}          Psychiatric Specialty Exam:  Presentation  General Appearance:  Fairly Groomed  Eye Contact: Good  Speech: Normal Rate  Speech Volume: Normal  Handedness: Right   Mood and Affect  Mood: Anxious  Affect: Congruent; Blunt   Thought Process  Thought Processes: Goal Directed  Descriptions of Associations:Intact  Orientation:Full (Time, Place and Person)  Thought Content:Illogical  History of  Schizophrenia/Schizoaffective disorder:No  Duration of Psychotic Symptoms:No data recorded Hallucinations:No data recorded Ideas of Reference:None  Suicidal Thoughts:No data recorded Homicidal Thoughts:No data recorded  Sensorium  Memory: Immediate Good  Judgment: Poor  Insight: Poor   Executive Functions  Concentration: Poor  Attention Span: Poor  Recall: Fair  Fund of Knowledge: Fair  Language: Good   Psychomotor Activity  Psychomotor Activity:No data  recorded  Assets  Assets: Communication Skills; Desire for Improvement; Social Support; Health and safety inspector; Housing   Sleep  Sleep:No data recorded   Physical Exam: Physical Exam ROS Blood pressure (!) 141/81, pulse 65, temperature 97.6 F (36.4 C), temperature source Oral, resp. rate 16, height 6' (1.829 m), weight 115.2 kg, SpO2 100%. Body mass index is 34.45 kg/m.  Treatment Plan Summary: {CHL [facility] MD TX XBJY:[id number]}  Disposition: {CHL [facility] Consult Plan:20772}  This service was provided via telemedicine using a 2-way, interactive audio and video technology.  Names of all persons participating in this telemedicine service and their role in this encounter. Name: *** Role: ***  Name: *** Role: ***  Name: *** Role: ***  Name: *** Role: ***    Jearld Lesch, NP 11/25/2022 10:41 PM

## 2022-11-26 DIAGNOSIS — F419 Anxiety disorder, unspecified: Secondary | ICD-10-CM | POA: Diagnosis not present

## 2022-11-26 DIAGNOSIS — F1910 Other psychoactive substance abuse, uncomplicated: Secondary | ICD-10-CM

## 2022-11-26 NOTE — ED Notes (Signed)
VOL/Discharge back home with mom

## 2022-11-26 NOTE — ED Notes (Signed)
Called pts mom to inform that he has been discharged and is in need of ride home. Pts mom unhappy with decision and requests to talk to provider who made the decision. Charge RN informed

## 2022-11-26 NOTE — Discharge Instructions (Signed)
Return to the ER for worsening symptoms, feelings of hurting yourself or others, or other concerns.

## 2022-11-26 NOTE — ED Provider Notes (Signed)
-----------------------------------------   2:08 AM on 11/26/2022 ----------------------------------------- .  Patient was evaluated by overnight psychiatry team and deemed psychiatrically stable for discharge home with mom.  Strict return precautions given.  Patient verbalizes understanding and agrees with plan of care.   Irean Hong, MD 11/26/22 0600

## 2023-03-22 ENCOUNTER — Emergency Department: Payer: MEDICAID

## 2023-03-22 ENCOUNTER — Other Ambulatory Visit: Payer: Self-pay

## 2023-03-22 ENCOUNTER — Inpatient Hospital Stay
Admission: EM | Admit: 2023-03-22 | Discharge: 2023-03-30 | DRG: 580 | Disposition: A | Discharge status: Home / Self Care | Payer: MEDICAID | Attending: Hospitalist | Admitting: Hospitalist

## 2023-03-22 ENCOUNTER — Encounter: Payer: Self-pay | Admitting: Emergency Medicine

## 2023-03-22 DIAGNOSIS — E66812 Obesity, class 2: Secondary | ICD-10-CM | POA: Diagnosis present

## 2023-03-22 DIAGNOSIS — R200 Anesthesia of skin: Secondary | ICD-10-CM | POA: Diagnosis present

## 2023-03-22 DIAGNOSIS — F1910 Other psychoactive substance abuse, uncomplicated: Secondary | ICD-10-CM | POA: Diagnosis present

## 2023-03-22 DIAGNOSIS — L02414 Cutaneous abscess of left upper limb: Principal | ICD-10-CM | POA: Diagnosis present

## 2023-03-22 DIAGNOSIS — L03114 Cellulitis of left upper limb: Secondary | ICD-10-CM | POA: Diagnosis present

## 2023-03-22 DIAGNOSIS — Z5941 Food insecurity: Secondary | ICD-10-CM | POA: Diagnosis not present

## 2023-03-22 DIAGNOSIS — I10 Essential (primary) hypertension: Secondary | ICD-10-CM | POA: Diagnosis present

## 2023-03-22 DIAGNOSIS — F909 Attention-deficit hyperactivity disorder, unspecified type: Secondary | ICD-10-CM | POA: Diagnosis present

## 2023-03-22 DIAGNOSIS — F32A Depression, unspecified: Secondary | ICD-10-CM | POA: Diagnosis not present

## 2023-03-22 DIAGNOSIS — Z880 Allergy status to penicillin: Secondary | ICD-10-CM | POA: Diagnosis not present

## 2023-03-22 DIAGNOSIS — F1510 Other stimulant abuse, uncomplicated: Secondary | ICD-10-CM | POA: Diagnosis present

## 2023-03-22 DIAGNOSIS — L089 Local infection of the skin and subcutaneous tissue, unspecified: Secondary | ICD-10-CM | POA: Diagnosis not present

## 2023-03-22 DIAGNOSIS — F419 Anxiety disorder, unspecified: Secondary | ICD-10-CM | POA: Diagnosis present

## 2023-03-22 DIAGNOSIS — F17290 Nicotine dependence, other tobacco product, uncomplicated: Secondary | ICD-10-CM | POA: Diagnosis present

## 2023-03-22 DIAGNOSIS — F1110 Opioid abuse, uncomplicated: Secondary | ICD-10-CM | POA: Diagnosis present

## 2023-03-22 DIAGNOSIS — J439 Emphysema, unspecified: Secondary | ICD-10-CM | POA: Diagnosis present

## 2023-03-22 DIAGNOSIS — L0390 Cellulitis, unspecified: Principal | ICD-10-CM

## 2023-03-22 DIAGNOSIS — Z23 Encounter for immunization: Secondary | ICD-10-CM

## 2023-03-22 DIAGNOSIS — F79 Unspecified intellectual disabilities: Secondary | ICD-10-CM | POA: Diagnosis present

## 2023-03-22 DIAGNOSIS — Z6836 Body mass index (BMI) 36.0-36.9, adult: Secondary | ICD-10-CM

## 2023-03-22 DIAGNOSIS — F1610 Hallucinogen abuse, uncomplicated: Secondary | ICD-10-CM | POA: Diagnosis present

## 2023-03-22 DIAGNOSIS — Z5982 Transportation insecurity: Secondary | ICD-10-CM | POA: Diagnosis not present

## 2023-03-22 DIAGNOSIS — R7881 Bacteremia: Secondary | ICD-10-CM | POA: Diagnosis present

## 2023-03-22 DIAGNOSIS — Z79899 Other long term (current) drug therapy: Secondary | ICD-10-CM

## 2023-03-22 DIAGNOSIS — F4310 Post-traumatic stress disorder, unspecified: Secondary | ICD-10-CM | POA: Diagnosis present

## 2023-03-22 DIAGNOSIS — B9561 Methicillin susceptible Staphylococcus aureus infection as the cause of diseases classified elsewhere: Secondary | ICD-10-CM | POA: Diagnosis present

## 2023-03-22 DIAGNOSIS — F17210 Nicotine dependence, cigarettes, uncomplicated: Secondary | ICD-10-CM | POA: Diagnosis present

## 2023-03-22 DIAGNOSIS — F319 Bipolar disorder, unspecified: Secondary | ICD-10-CM | POA: Diagnosis present

## 2023-03-22 LAB — CBC WITH DIFFERENTIAL/PLATELET
Abs Immature Granulocytes: 0.01 10*3/uL (ref 0.00–0.07)
Basophils Absolute: 0 10*3/uL (ref 0.0–0.1)
Basophils Relative: 1 %
Eosinophils Absolute: 0.4 10*3/uL (ref 0.0–0.5)
Eosinophils Relative: 5 %
HCT: 40.4 % (ref 39.0–52.0)
Hemoglobin: 13.3 g/dL (ref 13.0–17.0)
Immature Granulocytes: 0 %
Lymphocytes Relative: 30 %
Lymphs Abs: 2.3 10*3/uL (ref 0.7–4.0)
MCH: 30.1 pg (ref 26.0–34.0)
MCHC: 32.9 g/dL (ref 30.0–36.0)
MCV: 91.4 fL (ref 80.0–100.0)
Monocytes Absolute: 0.7 10*3/uL (ref 0.1–1.0)
Monocytes Relative: 10 %
Neutro Abs: 4.3 10*3/uL (ref 1.7–7.7)
Neutrophils Relative %: 54 %
Platelets: 290 10*3/uL (ref 150–400)
RBC: 4.42 MIL/uL (ref 4.22–5.81)
RDW: 12.4 % (ref 11.5–15.5)
WBC: 7.8 10*3/uL (ref 4.0–10.5)
nRBC: 0 % (ref 0.0–0.2)

## 2023-03-22 LAB — COMPREHENSIVE METABOLIC PANEL
ALT: 24 U/L (ref 0–44)
AST: 20 U/L (ref 15–41)
Albumin: 3.7 g/dL (ref 3.5–5.0)
Alkaline Phosphatase: 69 U/L (ref 38–126)
Anion gap: 10 (ref 5–15)
BUN: 18 mg/dL (ref 6–20)
CO2: 23 mmol/L (ref 22–32)
Calcium: 9.3 mg/dL (ref 8.9–10.3)
Chloride: 106 mmol/L (ref 98–111)
Creatinine, Ser: 0.86 mg/dL (ref 0.61–1.24)
GFR, Estimated: >60 mL/min (ref >60)
Glucose, Bld: 93 mg/dL (ref 70–99)
Potassium: 3.7 mmol/L (ref 3.5–5.1)
Sodium: 139 mmol/L (ref 135–145)
Total Bilirubin: 0.3 mg/dL (ref 0.0–1.2)
Total Protein: 6.9 g/dL (ref 6.5–8.1)

## 2023-03-22 LAB — LACTIC ACID, PLASMA
Lactic Acid, Venous: 0.9 mmol/L (ref 0.5–1.9)
Lactic Acid, Venous: 0.8 mmol/L (ref 0.5–1.9)

## 2023-03-22 LAB — SEDIMENTATION RATE: Sed Rate: 8 mm/h (ref 0–15)

## 2023-03-22 MED ORDER — MAGNESIUM HYDROXIDE 400 MG/5ML PO SUSP
30.0000 mL | Freq: Every day | ORAL | Status: DC | PRN
Start: 2023-03-22 — End: 2023-03-23

## 2023-03-22 MED ORDER — ACETAMINOPHEN 325 MG PO TABS
650.0000 mg | ORAL_TABLET | Freq: Four times a day (QID) | ORAL | Status: DC | PRN
Start: 2023-03-22 — End: 2023-03-23

## 2023-03-22 MED ORDER — BUSPIRONE HCL 10 MG PO TABS
15.0000 mg | ORAL_TABLET | Freq: Three times a day (TID) | ORAL | Status: DC
  Administered 2023-03-23 – 2023-03-29 (×17): 15 mg via ORAL
  Filled 2023-03-22 (×17): qty 2

## 2023-03-22 MED ORDER — SODIUM CHLORIDE 0.9 % IV SOLN: 1.0000 g | Freq: Once | INTRAVENOUS | Status: DC

## 2023-03-22 MED ORDER — VANCOMYCIN HCL 1750 MG/350ML IV SOLN
1750.0000 mg | Freq: Three times a day (TID) | INTRAVENOUS | Status: DC
  Filled 2023-03-22 (×3): qty 350

## 2023-03-22 MED ORDER — ARIPIPRAZOLE 2 MG PO TABS
15.0000 mg | ORAL_TABLET | Freq: Every day | ORAL | Status: DC
  Administered 2023-03-24 – 2023-03-29 (×4): 15 mg via ORAL
  Filled 2023-03-22: qty 8
  Filled 2023-03-22: qty 1
  Filled 2023-03-22 (×3): qty 8

## 2023-03-22 MED ORDER — ENOXAPARIN SODIUM 80 MG/0.8ML IJ SOSY
65.0000 mg | PREFILLED_SYRINGE | INTRAMUSCULAR | Status: DC
  Filled 2023-03-22: qty 0.65

## 2023-03-22 MED ORDER — ONDANSETRON HCL 4 MG/2ML IJ SOLN: 4.0000 mg | Freq: Four times a day (QID) | INTRAMUSCULAR | Status: DC | PRN

## 2023-03-22 MED ORDER — ONDANSETRON HCL 4 MG PO TABS: 4.0000 mg | ORAL_TABLET | Freq: Four times a day (QID) | ORAL | Status: DC | PRN

## 2023-03-22 MED ORDER — IOHEXOL 300 MG/ML  SOLN
100.0000 mL | Freq: Once | INTRAMUSCULAR | Status: AC | PRN
  Administered 2023-03-22: 100 mL via INTRAVENOUS

## 2023-03-22 MED ORDER — VANCOMYCIN HCL IN DEXTROSE 1-5 GM/200ML-% IV SOLN: 1000.0000 mg | Freq: Once | INTRAVENOUS | Status: DC

## 2023-03-22 MED ORDER — NALOXONE HCL 4 MG/0.1ML NA LIQD: 1.0000 | Freq: Once | NASAL | Status: DC | PRN

## 2023-03-22 MED ORDER — LORATADINE 10 MG PO TABS
10.0000 mg | ORAL_TABLET | Freq: Every day | ORAL | Status: DC
  Administered 2023-03-24 – 2023-03-29 (×4): 10 mg via ORAL
  Filled 2023-03-22 (×4): qty 1

## 2023-03-22 MED ORDER — ESCITALOPRAM OXALATE 10 MG PO TABS
10.0000 mg | ORAL_TABLET | Freq: Every day | ORAL | Status: DC
Start: 2023-03-23 — End: 2023-03-30
  Administered 2023-03-24 – 2023-03-29 (×4): 10 mg via ORAL
  Filled 2023-03-22 (×4): qty 1

## 2023-03-22 MED ORDER — ACETAMINOPHEN 650 MG RE SUPP: 650.0000 mg | Freq: Four times a day (QID) | RECTAL | Status: DC | PRN

## 2023-03-22 MED ORDER — METRONIDAZOLE 500 MG/100ML IV SOLN
500.0000 mg | Freq: Once | INTRAVENOUS | Status: AC
  Administered 2023-03-22: 500 mg via INTRAVENOUS
  Filled 2023-03-22: qty 100

## 2023-03-22 MED ORDER — SODIUM CHLORIDE 0.9 % IV SOLN: INTRAVENOUS | Status: AC

## 2023-03-22 MED ORDER — VANCOMYCIN HCL 2000 MG/400ML IV SOLN
2000.0000 mg | Freq: Once | INTRAVENOUS | Status: AC
  Administered 2023-03-23: 2000 mg via INTRAVENOUS
  Filled 2023-03-22: qty 400

## 2023-03-22 MED ORDER — ARIPIPRAZOLE ER 400 MG IM SRER: 400.0000 mg | INTRAMUSCULAR | Status: DC

## 2023-03-22 MED ORDER — LEVOFLOXACIN IN D5W 750 MG/150ML IV SOLN
750.0000 mg | Freq: Once | INTRAVENOUS | Status: DC
  Administered 2023-03-23: 750 mg via INTRAVENOUS
  Filled 2023-03-22: qty 150

## 2023-03-22 MED ORDER — SODIUM CHLORIDE 0.9 % IV SOLN
1.0000 g | Freq: Three times a day (TID) | INTRAVENOUS | Status: DC
  Administered 2023-03-23 (×3): 1 g via INTRAVENOUS
  Filled 2023-03-22 (×4): qty 20

## 2023-03-22 MED ORDER — METHADONE HCL 10 MG/ML PO CONC: 60.0000 mg | Freq: Every day | ORAL | Status: DC

## 2023-03-22 MED ORDER — TRAZODONE HCL 50 MG PO TABS
150.0000 mg | ORAL_TABLET | Freq: Every day | ORAL | Status: DC
  Administered 2023-03-23 – 2023-03-29 (×7): 150 mg via ORAL
  Filled 2023-03-22 (×7): qty 1

## 2023-03-22 NOTE — ED Triage Notes (Addendum)
 Patient presents POV for infection of left forearm from IV drug use ongoing for 3 days. Patient seen at [facility] today for same and dx with necrotizing fascitis but wanted to be seen here because he was told he would have to be admitted for IV antibiotics.

## 2023-03-22 NOTE — Progress Notes (Signed)
 Pharmacy Antibiotic Note  Ryan Chen is a 23 y.o. male admitted on 03/22/2023 with cellulitis.  Pharmacy has been consulted for Meropenem  & Vancomycin  dosing for 10 days.  Plan: Meropenem  1 gm q8hr per indication & renal fxn.  Pt given Vancomycin  2000 mg once. Vancomycin  1750 mg IV Q 8 hrs. Goal AUC 400-550. Expected AUC: 442.6 SCr used: 0.86  Pharmacy will continue to follow and will adjust abx dosing whenever warranted.  Temp (24hrs), Avg:97.8 F (36.6 C), Min:97.8 F (36.6 C), Max:97.8 F (36.6 C)   Recent Labs  Lab 03/22/23 2054 03/22/23 2144  WBC 7.8  --   CREATININE 0.86  --   LATICACIDVEN 0.9 0.8    Estimated Creatinine Clearance: 186.5 mL/min (by C-G formula based on SCr of 0.86 mg/dL).    Allergies  Allergen Reactions   Soy Allergy (Do Not Select) Swelling   Amoxicillin Nausea And Vomiting    Ineffective .SABRAHas patient had a PCN reaction causing immediate rash, facial/tongue/throat swelling, SOB or lightheadedness with hypotension: No Has patient had a PCN reaction causing severe rash involving mucus membranes or skin necrosis: No Has patient had a PCN reaction that required hospitalization No Has patient had a PCN reaction occurring within the last 10 years: Yes If all of the above answers are NO, then may proceed with Cephalosporin use.     Antimicrobials this admission: 1/10 Flagyl  >> x 1 dose 1/11 Meropenem  >> x 10 days 1/11 Vancomycin  >> x 10 days  Microbiology results: 1/10 BCx: Pending  Thank you for allowing pharmacy to be a part of this patient's care.  Ryan Chen, PharmD, MBA 03/22/2023 11:12 PM

## 2023-03-22 NOTE — Progress Notes (Signed)
 PHARMACIST - PHYSICIAN COMMUNICATION  CONCERNING:  Enoxaparin  (Lovenox ) for DVT Prophylaxis    RECOMMENDATION: Patient was prescribed enoxaprin 40mg  q24 hours for VTE prophylaxis.   Filed Weights   03/22/23 2040  Weight: 127 kg (280 lb)    Body mass index is 36.94 kg/m.  Estimated Creatinine Clearance: 186.5 mL/min (by C-G formula based on SCr of 0.86 mg/dL).   Based on [facility] policy patient is candidate for enoxaparin  0.5mg /kg TBW SQ every 24 hours based on BMI being >30.  DESCRIPTION: Pharmacy has adjusted enoxaparin  dose per [facility] policy.  Patient is now receiving enoxaparin  0.5 mg/kg every 24 hours   [name], PharmD, [facility] 03/22/2023 11:07 PM

## 2023-03-22 NOTE — ED Provider Notes (Signed)
 [facility] Provider Note    Event Date/Time   First MD Initiated Contact with Patient 03/22/23 2122     (approximate)   History   Wound Infection   HPI  Ryan Chen is a 23 y.o. male presents to the emergency department today after leaving outside hospital AMA.  Patient was seen at outside hospital for left forearm swelling pain and redness.  He states he shot up fentanyl  3 days ago.  He started noticing the swelling yesterday.  At outside hospital he had CT performed which showed small amount of gas in that area.  There was concern for nec fasc.  Patient was evaluated by orthopedic surgery who recommended OR and IV antibiotics.  The patient left AMA so acute come to our facility which is closer to his home.  He states he had not received any IV antibiotics prior to leaving.      Physical Exam   Triage Vital Signs: ED Triage Vitals  Encounter Vitals Group     BP 03/22/23 2037 (!) 140/81     Systolic BP Percentile --      Diastolic BP Percentile --      Pulse Rate 03/22/23 2037 (!) 125     Resp 03/22/23 2037 20     Temp 03/22/23 2037 97.8 F (36.6 C)     Temp Source 03/22/23 2037 Oral     SpO2 03/22/23 2037 96 %     Weight 03/22/23 2040 280 lb (127 kg)     Height 03/22/23 2040 6' 1 (1.854 m)     Head Circumference --      Peak Flow --      Pain Score 03/22/23 2040 4     Pain Loc --      Pain Education --      Exclude from Growth Chart --     Most recent vital signs: Vitals:   03/22/23 2037  BP: (!) 140/81  Pulse: (!) 125  Resp: 20  Temp: 97.8 F (36.6 C)  SpO2: 96%   General: Awake, alert, oriented. CV:  Good peripheral perfusion. Resp:  Normal effort.  Abd:  No distention.  Other:  Left forearm with swelling, erythema and warmth. No obvious crepitus.    ED Results / Procedures / Treatments   Labs (all labs ordered are listed, but only abnormal results are displayed) Labs Reviewed  CULTURE, BLOOD (ROUTINE X 2)   CULTURE, BLOOD (ROUTINE X 2)  CBC WITH DIFFERENTIAL/PLATELET  COMPREHENSIVE METABOLIC PANEL  LACTIC ACID, PLASMA  SEDIMENTATION RATE  LACTIC ACID, PLASMA     EKG None   RADIOLOGY I independently interpreted and visualized the CT forearm left. My interpretation: Subcutaneious emphysema. Radiology interpretation:  IMPRESSION:  1. Subcutaneus soft tissue edema of the elbow and forearm with  associated trace distal forearm emphysema. Overlying dermal  thickening. No organized fluid collection. No retained radiopaque  foreign body. Findings could represent cellulitis with necrotizing  fasciitis is not excluded-please note this is a clinical diagnosis.  2.  Negative for acute osseous abnormality.     PROCEDURES:  Critical Care performed: No   MEDICATIONS ORDERED IN ED: Medications  vancomycin  (VANCOCIN ) IVPB 1000 mg/200 mL premix (has no administration in time range)  metroNIDAZOLE  (FLAGYL ) IVPB 500 mg (has no administration in time range)  levofloxacin  (LEVAQUIN ) IVPB 750 mg (has no administration in time range)     IMPRESSION / MDM / ASSESSMENT AND PLAN / ED COURSE  I reviewed the triage  vital signs and the nursing notes.                              Differential diagnosis includes, but is not limited to, cellulitis, abscess, necrotizing fasciitis.   Patient's presentation is most consistent with acute presentation with potential threat to life or bodily function.  Patient presents to the emergency department today after leaving outside hospital AMA with concern for left forearm infection.  Per chart review he was seen by orthopedic surgery at outside hospital recommended admission for IV antibiotics and for irrigation and debridement of the left forearm.  Blood work here without concerning leukocytosis, elevated sed rate or lactic acidosis.  CT scan is concerning for subcutaneous emphysema. Discussed with Dr. Edie with orthopedics. At this time plan for admission for  IV abx and assessment in the morning. Discussed with Dr. Lawence with the hospitalist service who will evaluate for admission.   FINAL CLINICAL IMPRESSION(S) / ED DIAGNOSES   Final diagnoses:  Cellulitis, unspecified cellulitis site   Note:  This document was prepared using Dragon voice recognition software and may include unintentional dictation errors.    Floy Roberts, MD 03/22/23 2256

## 2023-03-22 NOTE — ED Notes (Addendum)
 Patient has IV in from triage that flushes and draws without issue. This RN unable to get a second line as veins are scarred from IV drug use. With this, IV ABX are to be given every hour as directed due to only having one IV in place at this time. Pt aware of meds needing to be infused and the timeframe of them to be completed. Pt is currently calm and cooperative texting and watching media on cellular phone. No issues noted or voiced at this time.

## 2023-03-22 NOTE — H&P (Signed)
 [place]   PATIENT NAME: Ryan Chen    MR#:  [id number]  DATE OF BIRTH:  [birth year]  DATE OF ADMISSION:  03/22/2023  PRIMARY CARE PHYSICIAN: Valora Lynwood, MD   Patient is coming from: Home  REQUESTING/REFERRING PHYSICIAN: Floy Roberts, MD  CHIEF COMPLAINT:   Chief Complaint  Patient presents with  . Wound Infection    HISTORY OF PRESENT ILLNESS:  Ryan Chen is a 23 y.o. Caucasian male with medical history significant for ADHD, depression, PTSD and polysubstance abuse including methamphetamine and fentanyl , who presented to the emergency room with a [name] of worsening left forearm erythema with induration, swelling and tenderness with pain.  He was seen at [facility] ED and left AMA as he wanted to be close to home coming here.  He admitted to injecting IV fentanyl  3 days ago.  He denied any fever or chills.  No chest pain or cough or wheezing or hemoptysis.  No nausea or vomiting or abdominal pain.  No dysuria, oliguria or hematuria or flank pain.  ED Course: When the patient came to the ER, BP was 140/81 with heart rate of 125 with otherwise normal vital signs.  Labs revealed unremarkable CMP and CBC.  Lactic acid was 0.9 and later 0.8.  Blood cultures were drawn. EKG as reviewed by me : None Imaging: Left forearm CT scan revealed the following: 1. Subcutaneus soft tissue edema of the elbow and forearm with associated trace distal forearm emphysema. Overlying dermal thickening. No organized fluid collection. No retained radiopaque foreign body. Findings could represent cellulitis with necrotizing fasciitis is not excluded-please note this is a clinical diagnosis. 2.  Negative for acute osseous abnormality.  The patient was given IV Levaquin , Flagyl  and vancomycin .  He will be admitted to a medical-surgical bed for further evaluation and management.   PAST MEDICAL HISTORY:   Past Medical History:  Diagnosis Date  . ADHD (attention deficit  hyperactivity disorder)   . Depression   . Headache(784.0)   . Intellectual disability   . PTSD (post-traumatic stress disorder)     PAST SURGICAL HISTORY:   Past Surgical History:  Procedure Laterality Date  . CIRCUMCISION    . TONSILLECTOMY Bilateral 2010    SOCIAL HISTORY:   Social History   Tobacco Use  . Smoking status: Some Days    Types: Cigarettes  . Smokeless tobacco: Never  Substance Use Topics  . Alcohol use: Not Currently  He admitted to substance abuse with IV fentanyl  as well as methamphetamine.  He vapes.  FAMILY HISTORY:   Family History  Adopted: Yes  Problem Relation Age of Onset  . Other Other        Fhx Heart Problems on Bio Father's Side    DRUG ALLERGIES:   Allergies  Allergen Reactions  . Soy Allergy (Do Not Select) Swelling  . Amoxicillin Nausea And Vomiting    Ineffective .SABRAHas patient had a PCN reaction causing immediate rash, facial/tongue/throat swelling, SOB or lightheadedness with hypotension: No Has patient had a PCN reaction causing severe rash involving mucus membranes or skin necrosis: No Has patient had a PCN reaction that required hospitalization No Has patient had a PCN reaction occurring within the last 10 years: Yes If all of the above answers are NO, then may proceed with Cephalosporin use.     REVIEW OF SYSTEMS:   ROS As per history of present illness. All pertinent systems were reviewed above. Constitutional, HEENT, cardiovascular, respiratory, GI, GU, musculoskeletal,  neuro, psychiatric, endocrine, integumentary and hematologic systems were reviewed and are otherwise negative/unremarkable except for positive findings mentioned above in the HPI.   MEDICATIONS AT HOME:   Prior to Admission medications   Medication Sig Start Date End Date Taking? Authorizing Provider  ABILIFY  MAINTENA 400 MG SRER injection Inject 400 mg into the muscle every 28 (twenty-eight) days. 09/26/22   [provider]  ARIPiprazole   (ABILIFY ) 15 MG tablet Take 1 tablet (15 mg total) by mouth daily. 09/17/22 12/16/22  Willo Dunnings, MD  busPIRone  (BUSPAR ) 15 MG tablet Take 15 mg by mouth 3 (three) times daily. 10/11/22   [provider]  cetirizine (ZYRTEC) 10 MG tablet Take 10 mg by mouth daily.    [provider]  escitalopram  (LEXAPRO ) 10 MG tablet Take 1 tablet (10 mg total) by mouth daily. 09/17/22 09/17/23  Willo Dunnings, MD  methadone  (DOLOPHINE ) 10 MG/ML solution Take 60 mg by mouth daily.    [provider]  naloxone  (NARCAN ) nasal spray 4 mg/0.1 mL sign 06/11/22   Suzanne Kirsch, MD  ondansetron  (ZOFRAN -ODT) 4 MG disintegrating tablet Take 1 tablet (4 mg total) by mouth every 6 (six) hours as needed for nausea or vomiting. 11/18/22   Dicky Anes, MD  traZODone  (DESYREL ) 150 MG tablet Take 150 mg by mouth at bedtime. 10/11/22   [provider]  sertraline (ZOLOFT) 50 MG tablet Take 1 tablet by mouth daily. 03/07/17 08/17/19  [provider]  VYVANSE 40 MG capsule Take one capsule by mouth every morning 01/07/16 08/17/19  [provider]      VITAL SIGNS:  Blood pressure 133/73, pulse (!) 106, temperature 98.1 F (36.7 C), temperature source Oral, resp. rate 16, height 6' 1 (1.854 m), weight 127 kg, SpO2 100%.  PHYSICAL EXAMINATION:  Physical Exam  GENERAL:  23 y.o.-year-old Caucasian male patient lying in the bed with no acute distress.  EYES: Pupils equal, round, reactive to light and accommodation. No scleral icterus. Extraocular muscles intact.  HEENT: Head atraumatic, normocephalic. Oropharynx and nasopharynx clear.  NECK:  Supple, no jugular venous distention. No thyroid enlargement, no tenderness.  LUNGS: Normal breath sounds bilaterally, no wheezing, rales,rhonchi or crepitation. No use of accessory muscles of respiration.  CARDIOVASCULAR: Regular rate and rhythm, S1, S2 normal. No murmurs, rubs, or gallops.  ABDOMEN: Soft, nondistended, nontender. Bowel sounds  present. No organomegaly or mass.  EXTREMITIES: No pedal edema, cyanosis, or clubbing.  NEUROLOGIC: Cranial nerves II through XII are intact. Muscle strength 5/5 in all extremities. Sensation intact. Gait not checked.  PSYCHIATRIC: The patient is alert and oriented x 3.  Normal affect and good eye contact. SKIN: No obvious rash, lesion, or ulcer.  Left posterior forearm swelling with erythema, warmth, induration, tenderness without fluctuation.   LABORATORY PANEL:   CBC Recent Labs  Lab 03/22/23 2054  WBC 7.8  HGB 13.3  HCT 40.4  PLT 290   ------------------------------------------------------------------------------------------------------------------  Chemistries  Recent Labs  Lab 03/22/23 2054  NA 139  K 3.7  CL 106  CO2 23  GLUCOSE 93  BUN 18  CREATININE 0.86  CALCIUM  9.3  AST 20  ALT 24  ALKPHOS 69  BILITOT 0.3   ------------------------------------------------------------------------------------------------------------------  Cardiac Enzymes No results for input(s): TROPONINI in the last 168 hours. ------------------------------------------------------------------------------------------------------------------  RADIOLOGY:  CT FOREARM LEFT W CONTRAST Result Date: 03/22/2023 CLINICAL DATA:  concern for nec fasc left forearm from IV drug use ongoing for 3 days. Patient seen at [facility] today for same and dx with  necrotizing fasciti EXAM: CT OF THE UPPER LEFT EXTREMITY WITH CONTRAST TECHNIQUE: Multidetector CT imaging of the upper left extremity was performed according to the standard protocol following intravenous contrast administration. RADIATION DOSE REDUCTION: This exam was performed according to the departmental dose-optimization program which includes automated exposure control, adjustment of the mA and/or kV according to patient size and/or use of iterative reconstruction technique. CONTRAST:  OMNIPAQUE  IOHEXOL  300 MG/ML  SOLN COMPARISON:  None  Available. FINDINGS: Bones/Joint/Cartilage No evidence of fracture, dislocation, or joint effusion. No evidence of severe arthropathy. No cortical erosion or destruction. No aggressive appearing focal bone abnormality. Ligaments Suboptimally assessed by CT. Muscles and Tendons Grossly unremarkable. Soft tissues Diffuse subcutaneus soft tissue edema of the visualized elbow and forearm most prominent along the dorsal aspect. Few foci of subcutaneus soft tissue gas along the medial distal forearm soft tissues (8:33). Overlying dermal thickening. No retained radiopaque foreign body. Vascular: Unremarkable. IMPRESSION: 1. Subcutaneus soft tissue edema of the elbow and forearm with associated trace distal forearm emphysema. Overlying dermal thickening. No organized fluid collection. No retained radiopaque foreign body. Findings could represent cellulitis with necrotizing fasciitis is not excluded-please note this is a clinical diagnosis. 2.  Negative for acute osseous abnormality. Electronically Signed   By: Morgane  Naveau M.D.   On: 03/22/2023 22:20      IMPRESSION AND PLAN:  Assessment and Plan: * Cellulitis of left forearm - The patient will be admitted to a medical-surgical bed. - Will continue antibiotic therapy with IV vancomycin  and meropenem . - Warm compresses will be utilized. - Given the possibility of necrotizing fasciitis orthopedic consultation will be obtained. - Dr. Edie was notified about the patient and is aware.  Polysubstance abuse (HCC) - She was counseled for cessation IV fentanyl  and methamphetamine as well as vaping.  Anxiety and depression - She also has bipolar disorder and PTSD. - We will continue BuSpar  and Lexapro  as well as trazodone  and Abilify .  Opiate abuse, continuous (HCC) Will continue methadone  while he is here.   DVT prophylaxis: Lovenox .  Advanced Care Planning:  Code Status: full code.  Family Communication:  The plan of care was discussed in details with  the patient (and family). I answered all questions. The patient agreed to proceed with the above mentioned plan. Further management will depend upon hospital course. Disposition Plan: Back to previous home environment Consults called: Orthopedic surgery All the records are reviewed and case discussed with ED provider.  Status is: Inpatient  At the time of the admission, it appears that the appropriate admission status for this patient is inpatient.  This is judged to be reasonable and necessary in order to provide the required intensity of service to ensure the patient's safety given the presenting symptoms, physical exam findings and initial radiographic and laboratory data in the context of comorbid conditions.  The patient requires inpatient status due to high intensity of service, high risk of further deterioration and high frequency of surveillance required.  I certify that at the time of admission, it is my clinical judgment that the patient will require inpatient hospital care extending more than 2 midnights.                            Dispo: The patient is from: Home              Anticipated d/c is to: Home  Patient currently is not medically stable to d/c.              Difficult to place patient: No  Madison DELENA Peaches M.D on 03/23/2023 at 5:13 AM  [facility]   From 7 PM-7 AM, contact night-coverage [email or web address]  CC: Primary care physician; Valora Agent, MD

## 2023-03-23 ENCOUNTER — Inpatient Hospital Stay: Payer: MEDICAID

## 2023-03-23 ENCOUNTER — Inpatient Hospital Stay: Payer: MEDICAID | Admitting: Anesthesiology

## 2023-03-23 ENCOUNTER — Encounter
Admission: EM | Disposition: A | Discharge status: Home / Self Care | Payer: Self-pay | Source: Home / Self Care | Attending: Internal Medicine

## 2023-03-23 DIAGNOSIS — L03114 Cellulitis of left upper limb: Secondary | ICD-10-CM | POA: Diagnosis not present

## 2023-03-23 DIAGNOSIS — F419 Anxiety disorder, unspecified: Secondary | ICD-10-CM | POA: Insufficient documentation

## 2023-03-23 DIAGNOSIS — F1110 Opioid abuse, uncomplicated: Secondary | ICD-10-CM

## 2023-03-23 DIAGNOSIS — F1910 Other psychoactive substance abuse, uncomplicated: Secondary | ICD-10-CM | POA: Diagnosis not present

## 2023-03-23 HISTORY — PX: INCISION AND DRAINAGE: SHX5863

## 2023-03-23 HISTORY — PX: APPLICATION OF WOUND VAC: SHX5189

## 2023-03-23 LAB — BLOOD CULTURE ID PANEL (REFLEXED) - BCID2

## 2023-03-23 LAB — CBC
HCT: 38.2 % — ABNORMAL LOW (ref 39.0–52.0)
Hemoglobin: 12.7 g/dL — ABNORMAL LOW (ref 13.0–17.0)
MCH: 30.1 pg (ref 26.0–34.0)
MCHC: 33.2 g/dL (ref 30.0–36.0)
MCV: 90.5 fL (ref 80.0–100.0)
Platelets: 275 10*3/uL (ref 150–400)
RBC: 4.22 MIL/uL (ref 4.22–5.81)
RDW: 12.4 % (ref 11.5–15.5)
WBC: 9.8 10*3/uL (ref 4.0–10.5)
nRBC: 0 % (ref 0.0–0.2)

## 2023-03-23 SURGERY — INCISION AND DRAINAGE
Anesthesia: General | Site: Arm Lower | Laterality: Left

## 2023-03-23 MED ORDER — DIPHENHYDRAMINE HCL 12.5 MG/5ML PO ELIX: 12.5000 mg | ORAL_SOLUTION | ORAL | Status: DC | PRN

## 2023-03-23 MED ORDER — DEXAMETHASONE SODIUM PHOSPHATE 10 MG/ML IJ SOLN
INTRAMUSCULAR | Status: DC | PRN
  Administered 2023-03-23: 10 mg via INTRAVENOUS

## 2023-03-23 MED ORDER — BUPIVACAINE HCL (PF) 0.5 % IJ SOLN
INTRAMUSCULAR | Status: AC
  Filled 2023-03-23: qty 20

## 2023-03-23 MED ORDER — MIDAZOLAM HCL 2 MG/2ML IJ SOLN
2.0000 mg | Freq: Once | INTRAMUSCULAR | Status: AC
  Administered 2023-03-23: 1 mg via INTRAVENOUS

## 2023-03-23 MED ORDER — ACETAMINOPHEN 500 MG PO TABS
1000.0000 mg | ORAL_TABLET | Freq: Four times a day (QID) | ORAL | Status: AC
  Administered 2023-03-23 – 2023-03-24 (×4): 1000 mg via ORAL
  Filled 2023-03-23 (×4): qty 2

## 2023-03-23 MED ORDER — PROPOFOL 10 MG/ML IV BOLUS
INTRAVENOUS | Status: AC
  Filled 2023-03-23: qty 40

## 2023-03-23 MED ORDER — ENOXAPARIN SODIUM 40 MG/0.4ML IJ SOSY
40.0000 mg | PREFILLED_SYRINGE | INTRAMUSCULAR | Status: DC
  Administered 2023-03-24 – 2023-03-29 (×5): 40 mg via SUBCUTANEOUS
  Filled 2023-03-23 (×5): qty 0.4

## 2023-03-23 MED ORDER — MAGNESIUM HYDROXIDE 400 MG/5ML PO SUSP: 30.0000 mL | Freq: Every day | ORAL | Status: DC | PRN

## 2023-03-23 MED ORDER — DOCUSATE SODIUM 100 MG PO CAPS
100.0000 mg | ORAL_CAPSULE | Freq: Two times a day (BID) | ORAL | Status: DC
  Administered 2023-03-23 – 2023-03-29 (×11): 100 mg via ORAL
  Filled 2023-03-23 (×12): qty 1

## 2023-03-23 MED ORDER — ONDANSETRON HCL 4 MG PO TABS: 4.0000 mg | ORAL_TABLET | Freq: Four times a day (QID) | ORAL | Status: DC | PRN

## 2023-03-23 MED ORDER — KETOROLAC TROMETHAMINE 15 MG/ML IJ SOLN
15.0000 mg | Freq: Four times a day (QID) | INTRAMUSCULAR | Status: AC
  Administered 2023-03-23 – 2023-03-24 (×4): 15 mg via INTRAVENOUS
  Filled 2023-03-23 (×4): qty 1

## 2023-03-23 MED ORDER — SUCCINYLCHOLINE CHLORIDE 200 MG/10ML IV SOSY
PREFILLED_SYRINGE | INTRAVENOUS | Status: DC | PRN
  Administered 2023-03-23: 140 mg via INTRAVENOUS

## 2023-03-23 MED ORDER — METOCLOPRAMIDE HCL 5 MG PO TABS: 5.0000 mg | ORAL_TABLET | Freq: Three times a day (TID) | ORAL | Status: DC | PRN

## 2023-03-23 MED ORDER — ONDANSETRON HCL 4 MG/2ML IJ SOLN
INTRAMUSCULAR | Status: DC | PRN
  Administered 2023-03-23: 4 mg via INTRAVENOUS

## 2023-03-23 MED ORDER — DEXTROSE 5 % IV SOLN
INTRAVENOUS | Status: DC | PRN
  Administered 2023-03-23: 3 g via INTRAVENOUS

## 2023-03-23 MED ORDER — KETAMINE HCL 50 MG/5ML IJ SOSY
PREFILLED_SYRINGE | INTRAMUSCULAR | Status: DC | PRN
  Administered 2023-03-23: 50 mg via INTRAVENOUS

## 2023-03-23 MED ORDER — OXYCODONE HCL 5 MG PO TABS: 5.0000 mg | ORAL_TABLET | Freq: Once | ORAL | Status: DC | PRN

## 2023-03-23 MED ORDER — ONDANSETRON HCL 4 MG/2ML IJ SOLN
4.0000 mg | Freq: Four times a day (QID) | INTRAMUSCULAR | Status: DC | PRN
  Administered 2023-03-25: 4 mg via INTRAVENOUS

## 2023-03-23 MED ORDER — METOCLOPRAMIDE HCL 5 MG/ML IJ SOLN: 5.0000 mg | Freq: Three times a day (TID) | INTRAMUSCULAR | Status: DC | PRN

## 2023-03-23 MED ORDER — MIDAZOLAM HCL 2 MG/2ML IJ SOLN
INTRAMUSCULAR | Status: AC
  Filled 2023-03-23: qty 2

## 2023-03-23 MED ORDER — ACETAMINOPHEN 10 MG/ML IV SOLN: 1000.0000 mg | Freq: Once | INTRAVENOUS | Status: DC | PRN

## 2023-03-23 MED ORDER — LIDOCAINE HCL (PF) 2 % IJ SOLN
INTRAMUSCULAR | Status: AC
  Filled 2023-03-23: qty 5

## 2023-03-23 MED ORDER — ACETAMINOPHEN 325 MG PO TABS
325.0000 mg | ORAL_TABLET | Freq: Four times a day (QID) | ORAL | Status: DC | PRN
Start: 2023-03-24 — End: 2023-03-30
  Administered 2023-03-27 – 2023-03-28 (×2): 650 mg via ORAL
  Filled 2023-03-23 (×2): qty 2

## 2023-03-23 MED ORDER — DEXMEDETOMIDINE HCL IN NACL 80 MCG/20ML IV SOLN
INTRAVENOUS | Status: DC | PRN
  Administered 2023-03-23: 16 ug via INTRAVENOUS
  Administered 2023-03-23 (×2): 12 ug via INTRAVENOUS

## 2023-03-23 MED ORDER — SUCCINYLCHOLINE CHLORIDE 200 MG/10ML IV SOSY
PREFILLED_SYRINGE | INTRAVENOUS | Status: AC
  Filled 2023-03-23: qty 10

## 2023-03-23 MED ORDER — LIDOCAINE HCL (CARDIAC) PF 100 MG/5ML IV SOSY
PREFILLED_SYRINGE | INTRAVENOUS | Status: DC | PRN
  Administered 2023-03-23: 100 mg via INTRAVENOUS

## 2023-03-23 MED ORDER — HYDROMORPHONE HCL 1 MG/ML IJ SOLN
0.5000 mg | INTRAMUSCULAR | Status: DC | PRN
Start: 2023-03-23 — End: 2023-03-23

## 2023-03-23 MED ORDER — PROPOFOL 10 MG/ML IV BOLUS
INTRAVENOUS | Status: DC | PRN
  Administered 2023-03-23: 200 mg via INTRAVENOUS

## 2023-03-23 MED ORDER — ONDANSETRON HCL 4 MG/2ML IJ SOLN: 4.0000 mg | Freq: Once | INTRAMUSCULAR | Status: DC | PRN

## 2023-03-23 MED ORDER — BISACODYL 10 MG RE SUPP: 10.0000 mg | Freq: Every day | RECTAL | Status: DC | PRN

## 2023-03-23 MED ORDER — FENTANYL CITRATE (PF) 100 MCG/2ML IJ SOLN: 25.0000 ug | INTRAMUSCULAR | Status: DC | PRN

## 2023-03-23 MED ORDER — FLEET ENEMA RE ENEM: 1.0000 | ENEMA | Freq: Once | RECTAL | Status: DC | PRN

## 2023-03-23 MED ORDER — CEFAZOLIN SODIUM-DEXTROSE 2-4 GM/100ML-% IV SOLN
2.0000 g | Freq: Three times a day (TID) | INTRAVENOUS | Status: DC
  Administered 2023-03-23 – 2023-03-30 (×19): 2 g via INTRAVENOUS
  Filled 2023-03-23 (×20): qty 100

## 2023-03-23 MED ORDER — KETOROLAC TROMETHAMINE 30 MG/ML IJ SOLN
INTRAMUSCULAR | Status: AC
  Filled 2023-03-23: qty 1

## 2023-03-23 MED ORDER — KETAMINE HCL 50 MG/5ML IJ SOSY
PREFILLED_SYRINGE | INTRAMUSCULAR | Status: AC
  Filled 2023-03-23: qty 5

## 2023-03-23 MED ORDER — SODIUM CHLORIDE 0.9 % IR SOLN
Status: DC | PRN
  Administered 2023-03-23: 3000 mL

## 2023-03-23 MED ORDER — OXYCODONE HCL 5 MG/5ML PO SOLN: 5.0000 mg | Freq: Once | ORAL | Status: DC | PRN

## 2023-03-23 MED ORDER — INFLUENZA VIRUS VACC SPLIT PF (FLUZONE) 0.5 ML IM SUSY
0.5000 mL | PREFILLED_SYRINGE | INTRAMUSCULAR | Status: AC
  Administered 2023-03-24: 0.5 mL via INTRAMUSCULAR
  Filled 2023-03-23: qty 0.5

## 2023-03-23 SURGICAL SUPPLY — 52 items
BASIN KIT SINGLE STR (MISCELLANEOUS) IMPLANT
BLADE SURG SZ10 CARB STEEL (BLADE) ×2 IMPLANT
BNDG COHESIVE 4X5 TAN STRL LF (GAUZE/BANDAGES/DRESSINGS) IMPLANT
BNDG ELASTIC 3X5.8 VLCR NS LF (GAUZE/BANDAGES/DRESSINGS) IMPLANT
BNDG ELASTIC 4INX 5YD STR LF (GAUZE/BANDAGES/DRESSINGS) ×2 IMPLANT
BNDG ELASTIC 6X5.8 VLCR NS LF (GAUZE/BANDAGES/DRESSINGS) IMPLANT
BNDG ESMARCH 4X12 STRL LF (GAUZE/BANDAGES/DRESSINGS) ×2 IMPLANT
CANISTER WOUND CARE 500ML ATS (WOUND CARE) IMPLANT
CHLORAPREP W/TINT 26 (MISCELLANEOUS) ×2 IMPLANT
CUFF TOURN SGL QUICK 18X4 (TOURNIQUET CUFF) IMPLANT
CUFF TRNQT CYL 24X4X16.5-23 (TOURNIQUET CUFF) IMPLANT
DRAPE FOR WOUND VAC UNIT (DRAPES) IMPLANT
DRSG TEGADERM 4X4.75 (GAUZE/BANDAGES/DRESSINGS) IMPLANT
DRSG VERAFLO VAC MED (GAUZE/BANDAGES/DRESSINGS) IMPLANT
ELECT REM PT RETURN 9FT ADLT (ELECTROSURGICAL) ×4
ELECTRODE REM PT RTRN 9FT ADLT (ELECTROSURGICAL) ×4 IMPLANT
GAUZE PACKING IODOFORM 1/2INX (GAUZE/BANDAGES/DRESSINGS) IMPLANT
GAUZE SPONGE 4X4 12PLY STRL (GAUZE/BANDAGES/DRESSINGS) ×4 IMPLANT
GAUZE STRETCH 2X75IN STRL (MISCELLANEOUS) IMPLANT
GLOVE BIO SURGEON STRL SZ8 (GLOVE) ×2 IMPLANT
GLOVE INDICATOR 8.0 STRL GRN (GLOVE) ×2 IMPLANT
GLOVE SURG ORTHO 8.5 STRL (GLOVE) ×2 IMPLANT
GOWN STRL REUS W/ TWL LRG LVL3 (GOWN DISPOSABLE) ×2 IMPLANT
GOWN STRL REUS W/ TWL XL LVL3 (GOWN DISPOSABLE) ×4 IMPLANT
IV NS IRRIG 3000ML ARTHROMATIC (IV SOLUTION) ×2 IMPLANT
KIT TURNOVER KIT A (KITS) ×4 IMPLANT
LABEL OR SOLS (LABEL) ×2 IMPLANT
MANIFOLD NEPTUNE II (INSTRUMENTS) ×2 IMPLANT
NDL SAFETY ECLIPSE 18X1.5 (NEEDLE) ×2 IMPLANT
NS IRRIG 1000ML POUR BTL (IV SOLUTION) ×4 IMPLANT
PACK EXTREMITY ARMC (MISCELLANEOUS) ×2 IMPLANT
PAD ABD DERMACEA PRESS 5X9 (GAUZE/BANDAGES/DRESSINGS) IMPLANT
PAD ARMBOARD 7.5X6 YLW CONV (MISCELLANEOUS) ×2 IMPLANT
PAD CAST 4YDX4 CTTN HI CHSV (CAST SUPPLIES) ×2 IMPLANT
PADDING CAST BLEND 4X4 STRL (MISCELLANEOUS) ×2 IMPLANT
PADDING CAST COTTON 6X4 STRL (CAST SUPPLIES) IMPLANT
POSITIONER HEAD 8X9X4 ADT (SOFTGOODS) ×2 IMPLANT
PULSAVAC PLUS IRRIG FAN TIP (DISPOSABLE) ×2
SPLINT CAST 1 STEP 3X12 (MISCELLANEOUS) ×2 IMPLANT
SPONGE T-LAP 18X18 ~~LOC~~+RFID (SPONGE) ×4 IMPLANT
STAPLER SKIN PROX 35W (STAPLE) ×2 IMPLANT
STOCKINETTE BIAS CUT 4 980044 (GAUZE/BANDAGES/DRESSINGS) ×2 IMPLANT
STOCKINETTE IMPERVIOUS 9X36 MD (GAUZE/BANDAGES/DRESSINGS) ×2 IMPLANT
SUT ETHILON 3 0 FSL (SUTURE) IMPLANT
SUT PROLENE 4 0 PS 2 18 (SUTURE) ×4 IMPLANT
SWAB CULTURE AMIES ANAERIB BLU (MISCELLANEOUS) ×2 IMPLANT
SWAB CULTURE ESWAB REG 1ML (MISCELLANEOUS) ×2 IMPLANT
SYR 10ML LL (SYRINGE) ×2 IMPLANT
SYR BULB IRRIG 60ML STRL (SYRINGE) ×2 IMPLANT
TIP FAN IRRIG PULSAVAC PLUS (DISPOSABLE) IMPLANT
TRAP FLUID SMOKE EVACUATOR (MISCELLANEOUS) ×2 IMPLANT
WATER STERILE IRR 500ML POUR (IV SOLUTION) ×2 IMPLANT

## 2023-03-23 NOTE — Assessment & Plan Note (Signed)
-   The patient will be admitted to a medical-surgical bed. - Will continue antibiotic therapy with IV vancomycin  and meropenem . - Warm compresses will be utilized. - Given the possibility of necrotizing fasciitis orthopedic consultation will be obtained. - Dr. Edie was notified about the patient and is aware.

## 2023-03-23 NOTE — Assessment & Plan Note (Signed)
 Will continue methadone while he is here.

## 2023-03-23 NOTE — Assessment & Plan Note (Addendum)
-   She also has bipolar disorder and PTSD. - We will continue BuSpar and Lexapro as well as trazodone and Abilify.

## 2023-03-23 NOTE — Anesthesia Procedure Notes (Signed)
 Procedure Name: Intubation Date/Time: 03/23/2023 11:14 AM  Performed by: Landy Francena BIRCH, CRNAPre-anesthesia Checklist: Patient identified, Emergency Drugs available, Suction available and Patient being monitored Patient Re-evaluated:Patient Re-evaluated prior to induction Oxygen  Delivery Method: Circle system utilized Preoxygenation: Pre-oxygenation with 100% oxygen  Induction Type: IV induction and Rapid sequence Laryngoscope Size: McGrath and 3 Grade View: Grade I Tube type: Oral Tube size: 7.5 mm Number of attempts: 1 Airway Equipment and Method: Stylet, Oral airway, Bite block and LTA kit utilized Placement Confirmation: ETT inserted through vocal cords under direct vision, positive ETCO2 and breath sounds checked- equal and bilateral Secured at: 22 cm Tube secured with: Tape Dental Injury: Teeth and Oropharynx as per pre-operative assessment

## 2023-03-23 NOTE — Assessment & Plan Note (Addendum)
-   She was counseled for cessation IV fentanyl and methamphetamine as well as vaping.

## 2023-03-23 NOTE — Progress Notes (Signed)
 Mother (Angie) at bedside and requests to see MD. Mother wants patient to have psych consult while he is inpatient to try to get him in detox and back on his meds. Mother advises that patient has not taken his psych meds for a long time, stating patient used to get an Abilify  injection every month and it has been over a month, Buspar , and trazodone  but that patient has not been taking these medications because he is an addict and out on the streets. Mother stated patient was not living at home as they could not allow this with his drug use, but that patient was now allowed to come home after discharge since he has promised to enter detox. Message sent to MD that mother is requesting to see him re: these matters. Mother's phone number also left for MD [phone]).

## 2023-03-23 NOTE — Progress Notes (Signed)
 PHARMACY - PHYSICIAN COMMUNICATION CRITICAL VALUE ALERT - BLOOD CULTURE IDENTIFICATION (BCID)  Ryan Chen is an 24 y.o. male who presented to [facility] on 03/22/2023 with a chief complaint of worsening left forearm erythema with induration, swelling, and tenderness with pain. CT scan showed soft tissue edema with trace forearm emphysema but no organized fluid collection.  1/11 Abscess from wound culture: Rare GPC   Assessment:  1/4 bottles (anaerobic) GPC; BCID Staph aureus, no resistance.    Name of physician (or Provider) Contacted: Erminio Cone, NP  Current antibiotics: Vancomycin , meropenem   Changes to prescribed antibiotics recommended:  Recommendations accepted by provider- Will discontinue both vancomycin  and meropenem  and order cefazolin   No results found for this or any previous visit.  Lum VEAR Mania, PharmD Clinical Pharmacist  03/23/2023  7:36 PM

## 2023-03-23 NOTE — Op Note (Signed)
 03/23/2023  12:14 PM  Patient:   Ryan Chen  Pre-Op Diagnosis:   Left forearm cellulitis with abscess.  Post-Op Diagnosis:   Same  Procedure:   Irrigation and debridement of left forearm abscess.  Surgeon:   DOROTHA Reyes Maltos, MD  Assistant:   None  Anesthesia:   GET  Findings:   As above.  Complications:   None  Fluids:   300 cc crystalloid  EBL:   10 cc  UOP:   None  TT:   28 minutes at 250 mmHg  Drains:   Wound VAC x 1  Closure:   None  Brief Clinical Note:   The patient is a 23 year old male with a history of PTSD, ADHD, depression, and IV drug abuse who presented with a 3-day history of pain, swelling, and erythema to the volo-ulnar aspect of the left mid forearm after injecting fentanyl  in this area.  A CT scan of the area showed no obvious fluid collections, but did demonstrate an area of air/gas concerning for gangrene.  The patient presents at this time for formal irrigation and debridement of the left forearm.  Procedure:   The patient was brought into the operating room and lain in the supine position.  After adequate general endotracheal intubation and anesthesia were obtained, the patient's left upper extremity was prepped with ChloraPrep solution before being draped sterilely.  Preoperative antibiotics were administered.  A timeout was performed to verify the appropriate surgical site.  The arm was held in an elevated position for several minutes before the tourniquet was inflated to 250 mmHg.  An approximately 8 to 10 cm incision was made longitudinally over the ulnar forearm centered over the area of maximal swelling/erythema while incorporating the injection site.  The incision was carried down through the subcutaneous tissues to expose the fascial layer.  The infection did not appear to penetrate the fascia.  A small amount of purulent material was identified and cultured.  There was a small area of abscess noted in the mid subcutaneous tissues which was  curetted.  There was no evidence for necrotizing fasciitis or areas of necrotic tissue.    The wound was copiously irrigated with 3 L of sterile saline solution using the jet lavage system.  It was elected to leave the wound open so a wound VAC sponge was cut to the appropriate size and inserted before the wound VAC system was applied over the wound.  The suction was turned on and the air seal verified.  The patient was then awakened, extubated, and returned to the recovery room in satisfactory condition after tolerating the procedure well.

## 2023-03-23 NOTE — Progress Notes (Signed)
  Progress Note   Patient: Ryan Chen FMW:[id number] DOB: [birth year] DOA: 03/22/2023     1 DOS: the patient was seen and examined on 03/23/2023   Brief hospital course: Ryan Chen is a 23 y.o. Caucasian male with medical history significant for ADHD, depression, PTSD and polysubstance abuse including methamphetamine and fentanyl , who presented to the emergency room with a Ryan Chen of worsening left forearm erythema with induration, swelling and tenderness with pain.  Injected IV fentanyl  to the arm 3 days ago. CT scan showed soft tissue edema with trace forearm emphysema but no organized fluid collection.  Due to not able to rule out necrotizing fasciitis, orthopedics consult was obtained.  Patient is treated with meropenem  and vancomycin .    Principal Problem:   Cellulitis of left forearm Active Problems:   Polysubstance abuse (HCC)   Opiate abuse, continuous (HCC)   Assessment and Plan:  * Cellulitis of left forearm Appreciated consult from podiatry.  Continue antibiotics with meropenem  and vancomycin . Blood cultures so far has no growth.  Will follow closely.  Polysubstance abuse (HCC) - TOC was counseled for cessation IV fentanyl  and methamphetamine as well as vaping.  Anxiety and depression Continue home medicines.  Opiate abuse, continuous (HCC) Continue methadone .  Obesity with BMI 36.94. Diet exercise    Subjective: Still complaining left arm pain and swelling.  No fever or chills, no nausea vomiting.  Had some loose stool today.  Physical Exam: Vitals:   03/22/23 2037 03/22/23 2040 03/22/23 2324 03/23/23 0336  BP: (!) 140/81  119/72 133/73  Pulse: (!) 125  (!) 105 (!) 106  Resp: 20  18 16   Temp: 97.8 F (36.6 C)  97.7 F (36.5 C) 98.1 F (36.7 C)  TempSrc: Oral  Oral Oral  SpO2: 96%  100% 100%  Weight:  127 kg    Height:  6' 1 (1.854 m)     General exam: Appears calm and comfortable  Respiratory system: Clear to auscultation. Respiratory  effort normal. Cardiovascular system: S1 & S2 heard, RRR. No JVD, murmurs, rubs, gallops or clicks. No pedal edema. Gastrointestinal system: Abdomen is nondistended, soft and nontender. No organomegaly or masses felt. Normal bowel sounds heard. Central nervous system: Alert and oriented. No focal neurological deficits. Extremities: Left arm swelling and tender. Skin: No rashes, lesions or ulcers Psychiatry: Judgement and insight appear normal. Mood & affect appropriate.    Data Reviewed:  Reviewed CT scan results and lab results.  Family Communication: None  Disposition: Status is: Inpatient Remains inpatient appropriate because: Severity of disease, IV treatment.     Time spent: 35 minutes  Author: Murvin Mana, MD 03/23/2023 10:36 AM  For on call review [email or web address].

## 2023-03-23 NOTE — TOC CM/SW Note (Signed)
 TOC consulted for SA resources. Added resources to AVS.  Alfonso Ramus, LCSW Transitions of Care Department [phone]

## 2023-03-23 NOTE — Transfer of Care (Signed)
 Immediate Anesthesia Transfer of Care Note  Patient: Ryan Chen  Procedure(s) Performed: INCISION AND DRAINAGE (Left: Arm Lower) APPLICATION OF WOUND VAC  Patient Location: PACU  Anesthesia Type:General  Level of Consciousness: drowsy  Airway & Oxygen  Therapy: Patient Spontanous Breathing and Patient connected to nasal cannula oxygen   Post-op Assessment: Report given to RN, Post -op Vital signs reviewed and stable, and Patient moving all extremities  Post vital signs: Reviewed and stable  Last Vitals:  Vitals Value Taken Time  BP 117/60 03/23/23 1215  Temp 36.2 C 03/23/23 1215  Pulse 69 03/23/23 1220  Resp 10 03/23/23 1220  SpO2 99 % 03/23/23 1220  Vitals shown include unfiled device data.  Last Pain:  Vitals:   03/23/23 0829  TempSrc:   PainSc: 0-No pain         Complications: No notable events documented.

## 2023-03-23 NOTE — Consult Note (Signed)
 ORTHOPAEDIC CONSULTATION  REQUESTING PHYSICIAN: Laurita Pillion, MD  Chief Complaint:   Left volar forearm pain, erythema, and swelling.  History of Present Illness: Ryan Chen is a 23 y.o. male with a history of ADHD, PTSD, depression, and IV drug abuse who presented to the emergency room complaining of a 3 day history of left volo-ulnar forearm pain and swelling after injecting IV fentanyl  into this area.  Apparently, the patient initially presented to the [facility] emergency room where a CT scan was obtained which demonstrated significant soft tissue swelling in the volar ulnar forearm region with evidence of subcutaneous air, concerning for a more significant infection.  He was evaluated by the orthopedic surgeon there who recommended irrigation and debridement of the forearm.  However, the patient left AMA because he wanted to be closer to home before undergoing any surgery.  He presented to our emergency room and was admitted last night in preparation for formal irrigation and debridement of his forearm this morning.  The patient denies any fevers or chills, and denies any numbness or paresthesias to his hand.  He also notes an area of continued scabbing over the posterior medial aspect of his right lower leg which developed after an IV drug injection into this area 3 weeks ago.  Past Medical History:  Diagnosis Date   ADHD (attention deficit hyperactivity disorder)    Depression    Headache(784.0)    Intellectual disability    PTSD (post-traumatic stress disorder)    Past Surgical History:  Procedure Laterality Date   CIRCUMCISION     TONSILLECTOMY Bilateral 2010   Social History   Socioeconomic History   Marital status: Single    Spouse name: Not on file   Number of children: Not on file   Years of education: Not on file   Highest education level: Not on file  Occupational History   Not on file  Tobacco  Use   Smoking status: Some Days    Types: Cigarettes   Smokeless tobacco: Never  Vaping Use   Vaping status: Every Day  Substance and Sexual Activity   Alcohol use: Not Currently   Drug use: Yes    Types: Cocaine    Comment: Last used 11/16/2022   Sexual activity: Never  Other Topics Concern   Not on file  Social History Narrative   Not on file   Social Drivers of Health   Financial Resource Strain: Not on file  Food Insecurity: No Food Insecurity (10/17/2022)   Hunger Vital Sign    Worried About Running Out of Food in the Last Year: Never true    Ran Out of Food in the Last Year: Never true  Transportation Needs: No Transportation Needs (10/17/2022)   PRAPARE - Administrator, Civil Service (Medical): No    Lack of Transportation (Non-Medical): No  Physical Activity: Not on file  Stress: Not on file  Social Connections: Not on file   Family History  Adopted: Yes  Problem Relation Age of Onset   Other Other        Fhx Heart Problems on Bio Father's Side   Allergies  Allergen Reactions   Soy Allergy (Do Not Select) Swelling   Amoxicillin Nausea And Vomiting    Ineffective .SABRAHas patient had a PCN reaction causing immediate rash, facial/tongue/throat swelling, SOB or lightheadedness with hypotension: No Has patient had a PCN reaction causing severe rash involving mucus membranes or skin necrosis: No Has patient had a PCN reaction that  required hospitalization No Has patient had a PCN reaction occurring within the last 10 years: Yes If all of the above answers are NO, then may proceed with Cephalosporin use.    Prior to Admission medications   Medication Sig Start Date End Date Taking? Authorizing Provider  ABILIFY  MAINTENA 400 MG SRER injection Inject 400 mg into the muscle every 28 (twenty-eight) days. 09/26/22  Yes [provider]  busPIRone  (BUSPAR ) 15 MG tablet Take 15 mg by mouth 3 (three) times daily. 10/11/22  Yes [provider]   cetirizine (ZYRTEC) 10 MG tablet Take 10 mg by mouth daily.   Yes [provider]  escitalopram  (LEXAPRO ) 10 MG tablet Take 1 tablet (10 mg total) by mouth daily. 09/17/22 09/17/23 Yes Willo Dunnings, MD  naloxone  (NARCAN ) nasal spray 4 mg/0.1 mL sign 06/11/22  Yes Mumma, Clotilda, MD  ondansetron  (ZOFRAN -ODT) 4 MG disintegrating tablet Take 1 tablet (4 mg total) by mouth every 6 (six) hours as needed for nausea or vomiting. 11/18/22  Yes Dicky Anes, MD  traZODone  (DESYREL ) 150 MG tablet Take 150 mg by mouth at bedtime. 10/11/22  Yes [provider]  ARIPiprazole  (ABILIFY ) 15 MG tablet Take 1 tablet (15 mg total) by mouth daily. 09/17/22 12/16/22  Willo Dunnings, MD  methadone  (DOLOPHINE ) 10 MG/ML solution Take 60 mg by mouth daily. Patient not taking: Reported on 03/23/2023    [provider]  sertraline (ZOLOFT) 50 MG tablet Take 1 tablet by mouth daily. 03/07/17 08/17/19  [provider]  VYVANSE 40 MG capsule Take one capsule by mouth every morning 01/07/16 08/17/19  [provider]   US  OR NERVE BLOCK-IMAGE ONLY [facility]) Result Date: 03/23/2023 There is no interpretation for this exam.  This order is for images obtained during a surgical procedure.  Please See Surgeries Tab for more information regarding the procedure.   US  OR NERVE BLOCK-IMAGE ONLY [facility]) Result Date: 03/23/2023 There is no interpretation for this exam.  This order is for images obtained during a surgical procedure.  Please See Surgeries Tab for more information regarding the procedure.   CT FOREARM LEFT W CONTRAST Result Date: 03/22/2023 CLINICAL DATA:  concern for nec fasc left forearm from IV drug use ongoing for 3 days. Patient seen at [facility] today for same and dx with necrotizing fasciti EXAM: CT OF THE UPPER LEFT EXTREMITY WITH CONTRAST TECHNIQUE: Multidetector CT imaging of the upper left extremity was performed according to the standard protocol following intravenous contrast  administration. RADIATION DOSE REDUCTION: This exam was performed according to the departmental dose-optimization program which includes automated exposure control, adjustment of the mA and/or kV according to patient size and/or use of iterative reconstruction technique. CONTRAST:  100mL OMNIPAQUE  IOHEXOL  300 MG/ML  SOLN COMPARISON:  None Available. FINDINGS: Bones/Joint/Cartilage No evidence of fracture, dislocation, or joint effusion. No evidence of severe arthropathy. No cortical erosion or destruction. No aggressive appearing focal bone abnormality. Ligaments Suboptimally assessed by CT. Muscles and Tendons Grossly unremarkable. Soft tissues Diffuse subcutaneus soft tissue edema of the visualized elbow and forearm most prominent along the dorsal aspect. Few foci of subcutaneus soft tissue gas along the medial distal forearm soft tissues (8:33). Overlying dermal thickening. No retained radiopaque foreign body. Vascular: Unremarkable. IMPRESSION: 1. Subcutaneus soft tissue edema of the elbow and forearm with associated trace distal forearm emphysema. Overlying dermal thickening. No organized fluid collection. No retained radiopaque foreign body. Findings could represent cellulitis with necrotizing fasciitis is not excluded-please note this is a clinical diagnosis. 2.  Negative for acute osseous abnormality. Electronically Signed   By: Morgane  Naveau M.D.   On: 03/22/2023 22:20   Positive ROS: All other systems have been reviewed and were otherwise negative with the exception of those mentioned in the HPI and as above.  Physical Exam: General:  Alert, no acute distress Psychiatric:  Patient is competent for consent with normal mood and affect   Cardiovascular:  No pedal edema Respiratory:  No wheezing, non-labored breathing GI:  Abdomen is soft and non-tender Skin:  No lesions in the area of chief complaint Neurologic:  Sensation intact distally Lymphatic:  No axillary or cervical  lymphadenopathy  Orthopedic Exam:  Orthopedic examination is limited to the left upper extremity and hand.  There is moderate swelling around the ulnar forearm region with some erythema.  The swelling and erythema edema extend up to just proximal to the elbow proximally and down to the ulnar side of the wrist distally.  There is a small area of scabbing in the midportion of the ulnar forearm, consistent with the presumed injection site.  He is able to active flex and extend all digits, although with mild discomfort.  He is grossly neurovascularly intact to the left forearm and hand.  Orthopedic examination of the right lower leg demonstrates a 1 to 1.5 cm area of scabbing in the posterior medial aspect of the mid-calf with mild surrounding erythema.  There is no drainage from the area and no underlying fluctuance.  The posterior calf compartment is soft.  He is grossly neurovascular intact to the right lower extremity and foot.  X-rays:  A recent CT scan of the left forearm with and without contrast is available for review and has been reviewed by myself.  The findings are as described above.  Assessment: 1.  Left volar forearm cellulitis with subcutaneous air. 2.  Right lower leg scab without any clinical sign of subcutaneous abscess.  Plan: The treatment options, including both surgical and nonsurgical choices, have been discussed in detail with the patient.  The patient would like to proceed with surgical intervention to include an irrigation and debridement with probable placement of a wound VAC over the left forearm region.  The risks (including bleeding, infection, nerve and/or blood vessel injury, persistent or recurrent pain, persistent or recurrent infection, stiffness/weakness of the left forearm and/or hand, need for further surgery, blood clots, strokes, heart attacks or arrhythmias, pneumonia, etc.) and benefits of the surgical procedure were discussed.  The patient states his  understanding and agrees to proceed.  A formal written consent will be obtained by the nursing staff.  Given the CT scan findings, I feel that this case is emergent and should not be delayed until 8 hours after him eating breakfast this morning.  Thank you for asking me to participate in the care of this most pleasant yet unfortunate young man.  I will be happy to follow him with you.   DOROTHA Reyes Maltos, MD  Beeper #:  [phone]  03/23/2023 10:53 AM

## 2023-03-23 NOTE — Progress Notes (Signed)
 A consult was placed to the IV Nurse for new IV access;  pt limited to Right arm only for all IVs, labs;  new iv placed with ultrasound; recommend a PICC if pt will require long term IV antibiotics.

## 2023-03-23 NOTE — Hospital Course (Addendum)
 Ryan Chen is a 23 y.o. Caucasian male with medical history significant for ADHD, depression, PTSD and polysubstance abuse including methamphetamine and fentanyl , who presented to the emergency room with a [name] of worsening left forearm erythema with induration, swelling and tenderness with pain.  Injected IV fentanyl  to the arm 3 days ago. CT scan showed soft tissue edema with trace forearm emphysema but no organized fluid collection.  Due to not able to rule out necrotizing fasciitis, orthopedics consult was obtained.  Patient is treated with meropenem  and vancomycin . Patient had I&D performed on 1/11, did not show evidence of necrotizing fasciitis.  Blood culture finalized with MSSA, wound culture still pending, antibiotic switched to cefazolin  on 1/11. TTE showed ejection fraction 50 to 55%, no valvular disease.  ID consult obtained on 1/13. Repeated wound irrigation 1/13.

## 2023-03-23 NOTE — Anesthesia Procedure Notes (Signed)
Anesthesia Regional Block: Supraclavicular block   Pre-Anesthetic Checklist: , timeout performed,  Correct Patient, Correct Site, Correct Laterality,  Correct Procedure, Correct Position, site marked,  Risks and benefits discussed,  Surgical consent,  Pre-op evaluation,  At surgeon's request and post-op pain management  Laterality: Left  Prep: chloraprep       Needles:  Injection technique: Single-shot  Needle Type: Echogenic Needle     Needle Length: 4cm  Needle Gauge: 25     Additional Needles:   Procedures:,,,, ultrasound used (permanent image in chart),,    Narrative:  Injection made incrementally with aspirations every 5 mL.  Performed by: Personally  Anesthesiologist: Arita Miss, MD  Additional Notes: Patient's chart reviewed and they were deemed appropriate candidate for procedure, per surgeon's request. Patient educated about risks, benefits, and alternatives of the block including but not limited to: temporary or permanent nerve damage, hemidiaphragmatic paralysis leading to dyspnea, pneumothorax, bleeding, infection, damage to surround tissues, block failure, local anesthetic toxicity. Patient expressed understanding. A formal time-out was conducted consistent with institution rules.  Monitors were applied, and minimal sedation used (see nursing record). The site was prepped with skin prep and allowed to dry, and sterile gloves were used. A high frequency linear ultrasound probe with probe cover was utilized throughout. Supraclavicular artery visualized, along with the brachial plexus adjacent to it and appeared anatomically normal. Local anesthetic injected around the plexus, and echogenic block needle trajectory was monitored throughout. Aspiration performed every 66m. Lung and blood vessels were avoided. All injections were performed without resistance and free of blood and paresthesias. The patient tolerated the procedure well.  Injectate: 225m0.5%  bupivacaine

## 2023-03-23 NOTE — Anesthesia Postprocedure Evaluation (Signed)
 Anesthesia Post Note  Patient: Ryan Chen  Procedure(s) Performed: INCISION AND DRAINAGE (Left: Arm Lower) APPLICATION OF WOUND VAC  Patient location during evaluation: PACU Anesthesia Type: General Level of consciousness: awake and alert Pain management: pain level controlled Vital Signs Assessment: post-procedure vital signs reviewed and stable Respiratory status: spontaneous breathing, nonlabored ventilation, respiratory function stable and patient connected to nasal cannula oxygen  Cardiovascular status: blood pressure returned to baseline and stable Postop Assessment: no apparent nausea or vomiting Anesthetic complications: no   No notable events documented.   Last Vitals:  Vitals:   03/23/23 1412 03/23/23 1603  BP: 129/78 133/68  Pulse: 80 82  Resp: 16 15  Temp: 36.6 C 36.7 C  SpO2: 97% 98%    Last Pain:  Vitals:   03/23/23 1412  TempSrc: Oral  PainSc:                  Rome Ade

## 2023-03-23 NOTE — Plan of Care (Signed)

## 2023-03-23 NOTE — ED Notes (Signed)
 Lab tech states she is unable to draw labs at this time due to pt veins being difficult to access. States another tech will attempt to draw labs later.

## 2023-03-23 NOTE — Anesthesia Preprocedure Evaluation (Signed)
 Anesthesia Evaluation  Patient identified by MRN, date of birth, ID band Patient awake  General Assessment Comment:  Patient appearing somewhat drowsy, but AO x 3 and cooperative.  Reviewed: Allergy & Precautions, NPO status , Patient's Chart, lab work & pertinent test results  History of Anesthesia Complications Negative for: history of anesthetic complications  Airway Mallampati: II  TM Distance: >3 FB Neck ROM: Full    Dental no notable dental hx. (+) Teeth Intact   Pulmonary asthma , neg sleep apnea, neg COPD, Patient abstained from smoking.Not current smoker Patient vapes. Denies tobacco use. States he was recently diagnosed with asthma. Says his breathing feels good today   Pulmonary exam normal breath sounds clear to auscultation       Cardiovascular Exercise Tolerance: Good METS(-) hypertension(-) CAD and (-) Past MI negative cardio ROS (-) dysrhythmias  Rhythm:Regular Rate:Normal - Systolic murmurs    Neuro/Psych  Headaches PSYCHIATRIC DISORDERS Anxiety Depression Bipolar Disorder      GI/Hepatic ,neg GERD  ,,(+)     substance abuse  cocaine use, methamphetamine use and IV drug useLast IV fentanyl  use was yesterday Last cocaine use one week ago Last methamphetamine use 3 days ago.   Endo/Other  neg diabetes    Renal/GU negative Renal ROS     Musculoskeletal  (+)  narcotic dependent  Abdominal  (+) + obese  Peds  Hematology   Anesthesia Other Findings Past Medical History: No date: ADHD (attention deficit hyperactivity disorder) No date: Depression No date: Headache(784.0) No date: Intellectual disability No date: PTSD (post-traumatic stress disorder)  Reproductive/Obstetrics                             Anesthesia Physical Anesthesia Plan  ASA: 2 and emergent  Anesthesia Plan: General   Post-op Pain Management: Regional block* and Ofirmev  IV (intra-op)*   Induction:  Intravenous and Rapid sequence  PONV Risk Score and Plan: 2 and Ondansetron , Dexamethasone , Midazolam  and Treatment may vary due to age or medical condition  Airway Management Planned: Oral ETT and Video Laryngoscope Planned  Additional Equipment: None  Intra-op Plan:   Post-operative Plan: Extubation in OR  Informed Consent: I have reviewed the patients History and Physical, chart, labs and discussed the procedure including the risks, benefits and alternatives for the proposed anesthesia with the patient or authorized representative who has indicated his/her understanding and acceptance.     Dental advisory given  Plan Discussed with: CRNA and Surgeon  Anesthesia Plan Comments: (Patient ate a full breakfast (bagel and oatmeal) at around 10am. I discussed with surgeon Dr. Edie about NPO time, and he deemed the risks of waiting that long too high from a tissue infection standpoint and is declaring this case emergent.  Discussed risks of anesthesia with patient, including PONV, sore throat, lip/dental/eye damage, aspiration. Rare risks discussed as well, such as cardiorespiratory and neurological sequelae, and allergic reactions. Plan for RSI. Discussed the role of CRNA in patient's perioperative care. Patient understands.  After consultation with Dr Edie surgeon (who believed any post-op risk of compartment syndrome to be minimal given that the infection was localized to subcutaneous tissues), and given that patient has opioid use disorder and would benefit from non-opioid pain control, Discussed r/b/a of supraclavicular nerve block with patient, including:  - bleeding, infection, nerve damage - pneumothorax - shortness of breath from hemidiaphragmatic paralysis due to phrenic nerve blockade - poor or non functioning block. - reactions and toxicity to  local anesthetic Patient understands and agrees. )       Anesthesia Quick Evaluation

## 2023-03-23 NOTE — Progress Notes (Signed)
 Pharmacy Antibiotic Note  Ryan Chen is a 23 y.o. male admitted on 03/22/2023 with cellulitis.  Pharmacy has been consulted for cefazolin  to finish 10 days of treatment given new culture results.  Plan: Discontinue vancomycin  and meropenem  Initiate cefazolin  2 g IV q8h Pharmacy will continue to follow and will adjust abx dosing whenever warranted.  Temp (24hrs), Avg:97.6 F (36.4 C), Min:96.9 F (36.1 C), Max:98.1 F (36.7 C)   Recent Labs  Lab 03/22/23 2054 03/22/23 2144 03/23/23 1431  WBC 7.8  --  9.8  CREATININE 0.86  --   --   LATICACIDVEN 0.9 0.8  --     Estimated Creatinine Clearance: 186.5 mL/min (by C-G formula based on SCr of 0.86 mg/dL).    Allergies  Allergen Reactions   Soy Allergy (Do Not Select) Swelling   Amoxicillin Nausea And Vomiting    Ineffective .SABRAHas patient had a PCN reaction causing immediate rash, facial/tongue/throat swelling, SOB or lightheadedness with hypotension: No Has patient had a PCN reaction causing severe rash involving mucus membranes or skin necrosis: No Has patient had a PCN reaction that required hospitalization No Has patient had a PCN reaction occurring within the last 10 years: Yes If all of the above answers are NO, then may proceed with Cephalosporin use.     Antimicrobials this admission: 1/10 Flagyl  >> x 1 dose 1/11 Meropenem  >> 1/11 1/11 Vancomycin  >> 1/11 1/11 Cefazolin >>  Microbiology results: 1/10 BCx: 1/4 bottles (anaerobic) GPC; BCID Staph aureus, no resistance detected 1/11 Abscess from wound culture: Rare GPC   Thank you for allowing pharmacy to be a part of this patient's care.  Lum Mania, PharmD Clinical Pharmacist  03/23/2023 8:09 PM

## 2023-03-24 ENCOUNTER — Encounter: Payer: Self-pay | Admitting: Surgery

## 2023-03-24 DIAGNOSIS — R7881 Bacteremia: Secondary | ICD-10-CM

## 2023-03-24 DIAGNOSIS — L03114 Cellulitis of left upper limb: Secondary | ICD-10-CM | POA: Diagnosis not present

## 2023-03-24 DIAGNOSIS — B9561 Methicillin susceptible Staphylococcus aureus infection as the cause of diseases classified elsewhere: Secondary | ICD-10-CM | POA: Insufficient documentation

## 2023-03-24 DIAGNOSIS — F1910 Other psychoactive substance abuse, uncomplicated: Secondary | ICD-10-CM | POA: Diagnosis not present

## 2023-03-24 LAB — MAGNESIUM: Magnesium: 2.1 mg/dL (ref 1.7–2.4)

## 2023-03-24 LAB — COMPREHENSIVE METABOLIC PANEL
ALT: 17 U/L (ref 0–44)
AST: 19 U/L (ref 15–41)
Albumin: 3.1 g/dL — ABNORMAL LOW (ref 3.5–5.0)
Alkaline Phosphatase: 55 U/L (ref 38–126)
Anion gap: 7 (ref 5–15)
BUN: 25 mg/dL — ABNORMAL HIGH (ref 6–20)
CO2: 23 mmol/L (ref 22–32)
Calcium: 9.1 mg/dL (ref 8.9–10.3)
Chloride: 106 mmol/L (ref 98–111)
Creatinine, Ser: 0.82 mg/dL (ref 0.61–1.24)
GFR, Estimated: >60 mL/min (ref >60)
Glucose, Bld: 137 mg/dL — ABNORMAL HIGH (ref 70–99)
Potassium: 4.4 mmol/L (ref 3.5–5.1)
Sodium: 136 mmol/L (ref 135–145)
Total Bilirubin: 0.4 mg/dL (ref 0.0–1.2)
Total Protein: 6.1 g/dL — ABNORMAL LOW (ref 6.5–8.1)

## 2023-03-24 LAB — GLUCOSE, CAPILLARY: Glucose-Capillary: 170 mg/dL — ABNORMAL HIGH (ref 70–99)

## 2023-03-24 MED ORDER — SALINE SPRAY 0.65 % NA SOLN
1.0000 | NASAL | Status: DC | PRN
  Administered 2023-03-24: 1 via NASAL
  Filled 2023-03-24: qty 44

## 2023-03-24 MED ORDER — OXYCODONE-ACETAMINOPHEN 5-325 MG PO TABS
1.0000 | ORAL_TABLET | ORAL | Status: DC | PRN
  Administered 2023-03-24: 1 via ORAL
  Filled 2023-03-24: qty 1

## 2023-03-24 NOTE — Progress Notes (Signed)
 Mom requests soy allergy be removed as "dietary will not let patient order most foods because they use a soy based cooking spray". MD notified and MD requested nurse remove soy allergy.

## 2023-03-24 NOTE — Plan of Care (Signed)
  Problem: Pain Management: Goal: General experience of comfort will improve Outcome: Progressing   Problem: Elimination: Goal: Will not experience complications related to urinary retention Outcome: Progressing

## 2023-03-24 NOTE — Progress Notes (Signed)
 [facility] ID Note  #MSSA bacteremia 2/2 likely LUE abscess #LUE abscess SP I&D #Hx of IVDA -repeat blood Cx -TTE -Continue cefazolin -Follow OR Cx -Official ID consult on MOnday

## 2023-03-24 NOTE — Progress Notes (Signed)
  Progress Note   Patient: Ryan Chen FMW:[id number] DOB: Nov 11, 2000 DOA: 03/22/2023     2 DOS: the patient was seen and examined on 03/24/2023   Brief hospital course: Ryan Chen is a 23 y.o. Caucasian male with medical history significant for ADHD, depression, PTSD and polysubstance abuse including methamphetamine and fentanyl , who presented to the emergency room with a [name] of worsening left forearm erythema with induration, swelling and tenderness with pain.  Injected IV fentanyl  to the arm 3 days ago. CT scan showed soft tissue edema with trace forearm emphysema but no organized fluid collection.  Due to not able to rule out necrotizing fasciitis, orthopedics consult was obtained.  Patient is treated with meropenem  and vancomycin . Patient had I&D performed on 1/11, did not show evidence of necrotizing fasciitis.  Blood culture came back with Staph aureus, wound culture grow gram positive cocci.    Principal Problem:   Cellulitis of left forearm Active Problems:   Polysubstance abuse (HCC)   Opiate abuse, continuous (HCC)   Staphylococcus aureus bacteremia   Assessment and Plan:  * Cellulitis of left forearm with abscess secondary to Staph aureus. Staph aureus bacteremia. Patient is status post I&D, no evidence of necrotizing fasciitis.  Blood culture in bottle come back with Staph aureus, currently on meropenem  and vancomycin . Will await final culture results, will request ID consult tomorrow.  Patient may also need a TEE. Patient feels better today, no fever or nausea vomiting. Continue as needed Percocet for pain control postop.   Polysubstance abuse (HCC) - TOC was counseled for cessation IV fentanyl  and methamphetamine as well as vaping.   Anxiety and depression Continue home medicines.   Opiate abuse, continuous (HCC) Discussed with patient, he is not currently taking methadone , he will check himself into rehab once discharged from hospital.  Obesity  with BMI 36.94. Diet exercise      Subjective:  Patient still has some pain in the arm, otherwise doing well.  No fever chills or nausea vomiting.  Physical Exam: Vitals:   03/23/23 2241 03/24/23 0138 03/24/23 0434 03/24/23 0919  BP: 135/67 130/61 134/64 (!) 167/90  Pulse: 87 68 62 (!) 59  Resp: 20 20 20 16   Temp: 97.7 F (36.5 C) 98.4 F (36.9 C) 98 F (36.7 C) (!) 97.4 F (36.3 C)  TempSrc:    Oral  SpO2: 96% 93% 96% 100%  Weight:      Height:       General exam: Appears calm and comfortable  Respiratory system: Clear to auscultation. Respiratory effort normal. Cardiovascular system: S1 & S2 heard, RRR. No JVD, murmurs, rubs, gallops or clicks. No pedal edema. Gastrointestinal system: Abdomen is nondistended, soft and nontender. No organomegaly or masses felt. Normal bowel sounds heard. Central nervous system: Alert and oriented. No focal neurological deficits. Extremities: Left arm swelling. Skin: No rashes, lesions or ulcers Psychiatry: Judgement and insight appear normal. Mood & affect appropriate.    Data Reviewed:  Lab results reviewed.  Family Communication: Mother updated yesterday evening with consent from patient.  Disposition: Status is: Inpatient Remains inpatient appropriate because: Severity of disease, IV treatment.     Time spent: 50 minutes  Author: Murvin Mana, MD 03/24/2023 10:34 AM  For on call review [email or web address].

## 2023-03-24 NOTE — Evaluation (Signed)
 Physical Therapy Evaluation Patient Details Name: Ryan Chen MRN: [id number] DOB: [birth year] Today's Date: 03/24/2023  History of Present Illness  Ryan Chen is a 22yoM who comes to [facility] on 03/22/23 Chen left forearm wound s/p IV drug use- pt seen for same at [facility] same day (dx with necrotizing fascitis), but elected to come to a more convenient hospital. PMH: ADHD, depression, PTSD, polysubabuse. Pt taken to OR for I&D  left forearm abscess with Dr. Edie.  Clinical Impression  Pt in bed on entry, agreeable to assessment. Pt educated on how to manage power cable to NPT device, safety with mobility during IV meds. Pt dons underpants and performs transfers, AMB at baseline level, no acute impairment, no safety concerns. Left lower arm and hand are still anesthetized, swollen- pt educated on LUE elevation when in bed. No additional PT services needed at this time- PT signing off.       If plan is discharge home, recommend the following:     Can travel by private vehicle        Equipment Recommendations None recommended by PT  Recommendations for Other Services       Functional Status Assessment Patient has not had a recent decline in their functional status     Precautions / Restrictions Precautions Precautions: None Restrictions Weight Bearing Restrictions Per Provider Order: No      Mobility  Bed Mobility Overal bed mobility: Independent                  Transfers Overall transfer level: Independent                      Ambulation/Gait Ambulation/Gait assistance: Modified independent (Device/Increase time) Gait Distance (Feet): 400 Feet Assistive device: None         General Gait Details: author manages IV pole, wound vac.  Stairs            Wheelchair Mobility     Tilt Bed    Modified Rankin (Stroke Patients Only)       Balance Overall balance assessment: Independent                                            Pertinent Vitals/Pain Pain Assessment Pain Assessment: No/denies pain    Home Living Family/patient expects to be discharged to:: Shelter/Homeless                   Additional Comments: parents will not allow pt back until he goes through drug rehab program and ceases drug use    Prior Function Prior Level of Function : Independent/Modified Independent                     Extremity/Trunk Assessment                Communication      Cognition Arousal: Alert Behavior During Therapy: WFL for tasks assessed/performed Overall Cognitive Status: Within Functional Limits for tasks assessed                                          General Comments      Exercises     Assessment/Plan    PT Assessment Patient does not need any further PT  services  PT Problem List         PT Treatment Interventions      PT Goals (Current goals can be found in the Care Plan section)  Acute Rehab PT Goals PT Goal Formulation: All assessment and education complete, DC therapy    Frequency       Co-evaluation               AM-PAC PT 6 Clicks Mobility  Outcome Measure Help needed turning from your back to your side while in a flat bed without using bedrails?: None Help needed moving from lying on your back to sitting on the side of a flat bed without using bedrails?: None Help needed moving to and from a bed to a chair (including a wheelchair)?: None Help needed standing up from a chair using your arms (e.g., wheelchair or bedside chair)?: None Help needed to walk in hospital room?: None Help needed climbing 3-5 steps with a railing? : None 6 Click Score: 24    End of Session   Activity Tolerance: Patient tolerated treatment well;No increased pain Patient left: in bed;with call bell/phone within reach   PT Visit Diagnosis: Other symptoms and signs involving the nervous system (R29.898)    Time: 9054-8997 PT Time Calculation (min)  (ACUTE ONLY): 17 min   Charges:   PT Evaluation $PT Eval Low Complexity: 1 Low   PT General Charges $$ ACUTE PT VISIT: 1 Visit        11:17 AM, 03/24/23 Ryan Chen, PT, DPT Physical Therapist - [facility]  [phone] (ASCOM)    Ryan Chen 03/24/2023, 11:15 AM

## 2023-03-24 NOTE — Plan of Care (Signed)

## 2023-03-24 NOTE — Progress Notes (Addendum)
 Subjective: 1 Day Post-Op Procedure(s) (LRB): INCISION AND DRAINAGE (Left) APPLICATION OF WOUND VAC Patient reports pain is improved.  Patient states overall pain swelling and numbness in the tips of the digits of the left hand are much improved since surgery yesterday. Patient is well, and has had no acute complaints or problems Denies any CP, SOB, ABD pain.  Objective: Vital signs in last 24 hours: Temp:  [96.9 F (36.1 C)-98.4 F (36.9 C)] 97.4 F (36.3 C) (01/12 0919) Pulse Rate:  [59-87] 59 (01/12 0919) Resp:  [7-20] 16 (01/12 0919) BP: (115-167)/(60-90) 167/90 (01/12 0919) SpO2:  [93 %-100 %] 100 % (01/12 0919)  Intake/Output from previous day: 01/11 0701 - 01/12 0700 In: 560.4 [I.V.:400; IV Piggyback:160.4] Out: 10 [Blood:10] Intake/Output this shift: No intake/output data recorded.  Recent Labs    03/22/23 2054 03/23/23 1431  HGB 13.3 12.7*   Recent Labs    03/22/23 2054 03/23/23 1431  WBC 7.8 9.8  RBC 4.42 4.22  HCT 40.4 38.2*  PLT 290 275   Recent Labs    03/22/23 2054 03/24/23 0446  NA 139 136  K 3.7 4.4  CL 106 106  CO2 23 23  BUN 18 25*  CREATININE 0.86 0.82  GLUCOSE 93 137*  CALCIUM  9.3 9.1   No results for input(s): LABPT, INR in the last 72 hours.  EXAM General - Patient is Alert, Appropriate, and Oriented Left upper extremity -left upper extremity shows patient has mild to moderate swelling through the forearm wrist and digits.  2+ radial pulse he is able to partially move the digits and has very little sensation loss to the tips of the digits.  Wrist range of motion is limited.  He is sore to palpation throughout the forearm but compartments feel soft and patient able to tolerate palpation and compression.  Ace wrap and wound VAC is intact.  50 cc of serous drainage within wound VAC canister.   Past Medical History:  Diagnosis Date   ADHD (attention deficit hyperactivity disorder)    Depression    Headache(784.0)     Intellectual disability    PTSD (post-traumatic stress disorder)     Assessment/Plan:   1 Day Post-Op Procedure(s) (LRB): INCISION AND DRAINAGE (Left) APPLICATION OF WOUND VAC Principal Problem:   Cellulitis of left forearm Active Problems:   Polysubstance abuse (HCC)   Opiate abuse, continuous (HCC)  Estimated body mass index is 36.94 kg/m as calculated from the following:   Height as of this encounter: 6' 1 (1.854 m).   Weight as of this encounter: 127 kg.  Continue with IV antibiotics.  Cultures pending.  Cultures showing gram-positive cocci  Dressing clean dry and intact.  50 cc of serous output wound VAC.  Labs and vital signs are stable  Patient overall seeing significant improvement with pain, swelling as well as numbness throughout the digits.   IVAR Medford Amber, PA-C [facility] 03/24/2023, 9:37 AM    Addendum: Given that the patient appears to be doing well, I will take him back to the operating room tomorrow for a repeat irrigation and debridement of the left forearm abscess with probable delayed primary closure of the wound.  This plan has been discussed in detail with the patient and his mother, who is at the bedside.  Both agree to proceed as planned.  The procedure has been discussed in detail, as have the potential risks (including bleeding, persistent or recurrent infection, nerve and/or blood vessel injury, weakness of the forearm  and/or hand, stiffness of the forearm and/or hand, need for further surgery, blood clots, strokes, heart attacks and/or arrhythmias, etc.) and benefits.  The patient states his understanding wishes to proceed.  A formal written consent will be obtained by the nursing staff.  DOROTHA Reyes Maltos, MD [facility] 03/24/2023, [phone]

## 2023-03-25 ENCOUNTER — Encounter: Payer: Self-pay | Admitting: Family Medicine

## 2023-03-25 ENCOUNTER — Inpatient Hospital Stay: Payer: MEDICAID

## 2023-03-25 ENCOUNTER — Other Ambulatory Visit: Payer: Self-pay

## 2023-03-25 ENCOUNTER — Encounter
Admission: EM | Disposition: A | Discharge status: Home / Self Care | Payer: Self-pay | Source: Home / Self Care | Attending: Internal Medicine

## 2023-03-25 ENCOUNTER — Inpatient Hospital Stay
Admit: 2023-03-25 | Discharge: 2023-03-25 | Disposition: A | Discharge status: Home / Self Care | Payer: MEDICAID | Attending: Internal Medicine

## 2023-03-25 DIAGNOSIS — B9561 Methicillin susceptible Staphylococcus aureus infection as the cause of diseases classified elsewhere: Secondary | ICD-10-CM | POA: Diagnosis not present

## 2023-03-25 DIAGNOSIS — R7881 Bacteremia: Secondary | ICD-10-CM | POA: Diagnosis not present

## 2023-03-25 DIAGNOSIS — F1910 Other psychoactive substance abuse, uncomplicated: Secondary | ICD-10-CM | POA: Diagnosis not present

## 2023-03-25 DIAGNOSIS — L03114 Cellulitis of left upper limb: Secondary | ICD-10-CM | POA: Diagnosis not present

## 2023-03-25 DIAGNOSIS — L089 Local infection of the skin and subcutaneous tissue, unspecified: Secondary | ICD-10-CM | POA: Diagnosis not present

## 2023-03-25 HISTORY — PX: INCISION AND DRAINAGE: SHX5863

## 2023-03-25 LAB — CULTURE, BLOOD (ROUTINE X 2): Special Requests: ADEQUATE

## 2023-03-25 LAB — ECHOCARDIOGRAM COMPLETE BUBBLE STUDY
AV Mean grad: 3 mm[Hg]
AV Peak grad: 6.1 mm[Hg]
Ao pk vel: 1.23 m/s
Area-P 1/2: 3.01 cm2

## 2023-03-25 SURGERY — INCISION AND DRAINAGE
Anesthesia: General | Laterality: Left

## 2023-03-25 MED ORDER — MIDAZOLAM HCL 5 MG/5ML IJ SOLN
INTRAMUSCULAR | Status: DC | PRN
  Administered 2023-03-25: 1 mg via INTRAVENOUS

## 2023-03-25 MED ORDER — DEXAMETHASONE SODIUM PHOSPHATE 10 MG/ML IJ SOLN
INTRAMUSCULAR | Status: AC
Start: 2023-03-25 — End: ?
  Filled 2023-03-25: qty 1

## 2023-03-25 MED ORDER — KETOROLAC TROMETHAMINE 15 MG/ML IJ SOLN
15.0000 mg | Freq: Four times a day (QID) | INTRAMUSCULAR | Status: AC
  Administered 2023-03-26 (×2): 15 mg via INTRAVENOUS
  Filled 2023-03-25 (×2): qty 1

## 2023-03-25 MED ORDER — PROPOFOL 10 MG/ML IV BOLUS
INTRAVENOUS | Status: DC | PRN
  Administered 2023-03-25 (×2): 200 mg via INTRAVENOUS

## 2023-03-25 MED ORDER — SODIUM CHLORIDE 0.9 % IV SOLN: INTRAVENOUS | Status: DC

## 2023-03-25 MED ORDER — OXYCODONE HCL 5 MG PO TABS
ORAL_TABLET | ORAL | Status: AC
  Filled 2023-03-25: qty 1

## 2023-03-25 MED ORDER — FENTANYL CITRATE (PF) 100 MCG/2ML IJ SOLN
25.0000 ug | INTRAMUSCULAR | Status: DC | PRN
  Administered 2023-03-25 (×4): 25 ug via INTRAVENOUS

## 2023-03-25 MED ORDER — SUCCINYLCHOLINE CHLORIDE 200 MG/10ML IV SOSY
PREFILLED_SYRINGE | INTRAVENOUS | Status: DC | PRN
  Administered 2023-03-25: 100 mg via INTRAVENOUS

## 2023-03-25 MED ORDER — ACETAMINOPHEN 500 MG PO TABS
1000.0000 mg | ORAL_TABLET | Freq: Four times a day (QID) | ORAL | Status: AC
  Administered 2023-03-26: 1000 mg via ORAL
  Filled 2023-03-25: qty 2

## 2023-03-25 MED ORDER — DEXAMETHASONE SODIUM PHOSPHATE 10 MG/ML IJ SOLN
INTRAMUSCULAR | Status: DC | PRN
  Administered 2023-03-25: 4 mg via INTRAVENOUS

## 2023-03-25 MED ORDER — DEXMEDETOMIDINE HCL IN NACL 80 MCG/20ML IV SOLN
INTRAVENOUS | Status: AC
  Filled 2023-03-25: qty 20

## 2023-03-25 MED ORDER — 0.9 % SODIUM CHLORIDE (POUR BTL) OPTIME
TOPICAL | Status: DC | PRN
  Administered 2023-03-25: 1000 mL

## 2023-03-25 MED ORDER — FENTANYL CITRATE (PF) 100 MCG/2ML IJ SOLN
INTRAMUSCULAR | Status: AC
  Filled 2023-03-25: qty 2

## 2023-03-25 MED ORDER — LACTATED RINGERS IV SOLN: INTRAVENOUS | Status: DC | PRN

## 2023-03-25 MED ORDER — PROPOFOL 1000 MG/100ML IV EMUL
INTRAVENOUS | Status: AC
  Filled 2023-03-25: qty 200

## 2023-03-25 MED ORDER — LIDOCAINE HCL (CARDIAC) PF 100 MG/5ML IV SOSY
PREFILLED_SYRINGE | INTRAVENOUS | Status: DC | PRN
  Administered 2023-03-25: 30 mg via INTRAVENOUS

## 2023-03-25 MED ORDER — ACETAMINOPHEN 10 MG/ML IV SOLN
INTRAVENOUS | Status: AC
  Filled 2023-03-25: qty 100

## 2023-03-25 MED ORDER — OXYCODONE HCL 5 MG PO TABS
5.0000 mg | ORAL_TABLET | Freq: Once | ORAL | Status: AC | PRN
  Administered 2023-03-25: 5 mg via ORAL

## 2023-03-25 MED ORDER — DROPERIDOL 2.5 MG/ML IJ SOLN: 0.6250 mg | Freq: Once | INTRAMUSCULAR | Status: DC | PRN

## 2023-03-25 MED ORDER — OXYCODONE HCL 5 MG/5ML PO SOLN: 5.0000 mg | Freq: Once | ORAL | Status: AC | PRN

## 2023-03-25 MED ORDER — SUCCINYLCHOLINE CHLORIDE 200 MG/10ML IV SOSY
PREFILLED_SYRINGE | INTRAVENOUS | Status: AC
  Filled 2023-03-25: qty 10

## 2023-03-25 MED ORDER — DEXTROSE-SODIUM CHLORIDE 5-0.9 % IV SOLN: INTRAVENOUS | Status: AC

## 2023-03-25 MED ORDER — LIDOCAINE HCL (PF) 2 % IJ SOLN
INTRAMUSCULAR | Status: AC
  Filled 2023-03-25: qty 5

## 2023-03-25 MED ORDER — KETOROLAC TROMETHAMINE 30 MG/ML IJ SOLN
30.0000 mg | Freq: Once | INTRAMUSCULAR | Status: AC
  Administered 2023-03-25: 30 mg via INTRAVENOUS

## 2023-03-25 MED ORDER — ACETAMINOPHEN 10 MG/ML IV SOLN
1000.0000 mg | Freq: Once | INTRAVENOUS | Status: DC | PRN
  Administered 2023-03-25: 1000 mg via INTRAVENOUS

## 2023-03-25 MED ORDER — DEXMEDETOMIDINE HCL IN NACL 80 MCG/20ML IV SOLN
INTRAVENOUS | Status: DC | PRN
  Administered 2023-03-25: 20 ug via INTRAVENOUS

## 2023-03-25 MED ORDER — MIDAZOLAM HCL 2 MG/2ML IJ SOLN
INTRAMUSCULAR | Status: AC
  Filled 2023-03-25: qty 2

## 2023-03-25 MED ORDER — KETAMINE HCL 50 MG/5ML IJ SOSY
PREFILLED_SYRINGE | INTRAMUSCULAR | Status: AC
  Filled 2023-03-25: qty 5

## 2023-03-25 MED ORDER — KETOROLAC TROMETHAMINE 30 MG/ML IJ SOLN
INTRAMUSCULAR | Status: AC
  Filled 2023-03-25: qty 1

## 2023-03-25 MED ORDER — ONDANSETRON HCL 4 MG/2ML IJ SOLN
INTRAMUSCULAR | Status: AC
  Filled 2023-03-25: qty 2

## 2023-03-25 MED ORDER — KETAMINE HCL 50 MG/5ML IJ SOSY
PREFILLED_SYRINGE | INTRAMUSCULAR | Status: DC | PRN
  Administered 2023-03-25: 30 mg via INTRAVENOUS
  Administered 2023-03-25: 20 mg via INTRAVENOUS

## 2023-03-25 SURGICAL SUPPLY — 21 items
BNDG ELASTIC 4INX 5YD STR LF (GAUZE/BANDAGES/DRESSINGS) ×1 IMPLANT
BNDG ESMARCH 4X12 STRL LF (GAUZE/BANDAGES/DRESSINGS) ×1 IMPLANT
CHLORAPREP W/TINT 26 (MISCELLANEOUS) ×1 IMPLANT
CUFF TRNQT CYL 24X4X16.5-23 (TOURNIQUET CUFF) IMPLANT
ELECT REM PT RETURN 9FT ADLT (ELECTROSURGICAL) ×1
ELECTRODE REM PT RTRN 9FT ADLT (ELECTROSURGICAL) ×1 IMPLANT
GLOVE BIO SURGEON STRL SZ8 (GLOVE) ×1 IMPLANT
GLOVE INDICATOR 8.0 STRL GRN (GLOVE) ×1 IMPLANT
GLOVE SURG ORTHO 8.5 STRL (GLOVE) ×1 IMPLANT
GOWN STRL REUS W/ TWL LRG LVL3 (GOWN DISPOSABLE) ×1 IMPLANT
GOWN STRL REUS W/ TWL XL LVL3 (GOWN DISPOSABLE) ×1 IMPLANT
KIT TURNOVER KIT A (KITS) ×1 IMPLANT
MANIFOLD NEPTUNE II (INSTRUMENTS) ×1 IMPLANT
NS IRRIG 1000ML POUR BTL (IV SOLUTION) ×1 IMPLANT
PACK EXTREMITY ARMC (MISCELLANEOUS) ×1 IMPLANT
PAD ABD DERMACEA PRESS 5X9 (GAUZE/BANDAGES/DRESSINGS) IMPLANT
PADDING CAST BLEND 4X4 STRL (MISCELLANEOUS) ×1 IMPLANT
STOCKINETTE IMPERVIOUS 9X36 MD (GAUZE/BANDAGES/DRESSINGS) ×1 IMPLANT
SUT PDS AB 0 CT1 27 (SUTURE) IMPLANT
SYR 10ML LL (SYRINGE) ×1 IMPLANT
TRAP FLUID SMOKE EVACUATOR (MISCELLANEOUS) ×1 IMPLANT

## 2023-03-25 NOTE — Op Note (Signed)
 03/22/2023 - 03/25/2023  3:52 PM  Patient:   Ryan Chen  Pre-Op Diagnosis:   Status post I&D of left forearm abscess.  Post-Op Diagnosis:   Same  Procedure:   Repeat I&D with delayed primary closure of left forearm wound.  Surgeon:   DOROTHA Reyes Maltos, MD  Assistant:   None  Anesthesia:   GET  Findings:   As above.  Complications:   None  Fluids:   400 cc crystalloid  EBL:   10 cc  UOP:   None  TT:   18 minutes at 250 mmHg  Drains:   Penrose x 1  Closure:   #0 Prolene interrupted sutures  Brief Clinical Note:   The patient is a 23 year old male who is now 2 days status post a formal irrigation and debridement of a left forearm abscess secondary to self-injected fentanyl  from 3 days prior.  The patient's cultures have grown out methicillin sensitive staph.  The patient presents at this time for removal of the wound VAC with repeat irrigation and debridement and delayed primary closure  Procedure:   The patient was brought into the operating room and laid in the supine position.  After adequate general endotracheal intubation and anesthesia were obtained, the patient's wound VAC was removed before the left upper extremity was prepped with a Betadine prep solution and draped sterilely.  Preoperative/perioperative antibiotics were continued.  A timeout was performed to verify the appropriate surgical site before the tourniquet was inflated to 250 mmHg after holding the arm in an elevated position for several minutes.  The previous wound was carefully inspected and found to show no evidence of any devitalized tissue the wound was copiously irrigated with a liter of sterile saline solution using bulb irrigation before it was closed using #0 Prolene interrupted sutures.  A Penrose drain was placed within the wound prior to closure to reduce the likelihood of a hematoma formation.  A sterile bulky compressive dressing was then applied to the forearm before the patient was awakened,  extubated, and returned to the recovery room in satisfactory condition after tolerating the procedure well.

## 2023-03-25 NOTE — Progress Notes (Signed)
 OT Cancellation Note  Patient Details Name: Ryan Chen MRN: [id number] DOB: Aug 18, 2000   Cancelled Treatment:    Reason Eval/Treat Not Completed: Other (comment). Chart reviewed. Per family in hallway pt is agitated awaiting procedure this PM. Requesting to hold evaluation and initiate as appropriate.   Elston Slot, M.S. OTR/L  03/25/23, 8:50 AM  ascom [phone]

## 2023-03-25 NOTE — Plan of Care (Signed)
  Problem: Education: Goal: Knowledge of General Education information will improve Description: Including pain rating scale, medication(s)/side effects and non-pharmacologic comfort measures Outcome: Progressing   Problem: Clinical Measurements: Goal: Will remain free from infection Outcome: Progressing   Problem: Nutrition: Goal: Adequate nutrition will be maintained Outcome: Progressing   Problem: Pain Management: Goal: General experience of comfort will improve Outcome: Progressing

## 2023-03-25 NOTE — Transfer of Care (Signed)
 Immediate Anesthesia Transfer of Care Note  Patient: Ryan Chen  Procedure(s) Performed: REPEAT INCISION AND DRAINAGE, WITH PRIMARY CLOSURE (Left)  Patient Location: PACU  Anesthesia Type:General  Level of Consciousness: awake, alert , and oriented  Airway & Oxygen  Therapy: Patient Spontanous Breathing  Post-op Assessment: Report given to RN and Post -op Vital signs reviewed and stable  Post vital signs: stable  Last Vitals:  Vitals Value Taken Time  BP    Temp    Pulse    Resp    SpO2      Last Pain:  Vitals:   03/25/23 1400  TempSrc:   PainSc: 3          Complications: No notable events documented.

## 2023-03-25 NOTE — Progress Notes (Signed)
 Pt to have TEE tomorrow to eval behind valves in heart. Patient verbalizes understanding.

## 2023-03-25 NOTE — Anesthesia Preprocedure Evaluation (Signed)
 Anesthesia Evaluation  Patient identified by MRN, date of birth, ID band Patient awake    Reviewed: Allergy & Precautions, H&P , NPO status , Patient's Chart, lab work & pertinent test results, reviewed documented beta blocker date and time   Airway Mallampati: II  TM Distance: >3 FB Neck ROM: full    Dental  (+) Teeth Intact   Pulmonary neg pulmonary ROS, Current Smoker and Patient abstained from smoking.   Pulmonary exam normal        Cardiovascular Exercise Tolerance: Good negative cardio ROS Normal cardiovascular exam Rhythm:regular Rate:Normal     Neuro/Psych  Headaches PSYCHIATRIC DISORDERS Anxiety Depression Bipolar Disorder      GI/Hepatic negative GI ROS, Neg liver ROS,,,  Endo/Other  negative endocrine ROS    Renal/GU negative Renal ROS  negative genitourinary   Musculoskeletal   Abdominal   Peds  Hematology negative hematology ROS (+)   Anesthesia Other Findings Past Medical History: No date: ADHD (attention deficit hyperactivity disorder) No date: Depression No date: Headache(784.0) No date: Intellectual disability No date: PTSD (post-traumatic stress disorder) Past Surgical History: 03/23/2023: APPLICATION OF WOUND VAC     Comment:  Procedure: APPLICATION OF WOUND VAC;  Surgeon: Edie Norleen PARAS, MD;  Location: [facility] ORS;  Service: Orthopedics;; No date: CIRCUMCISION 03/23/2023: INCISION AND DRAINAGE; Left     Comment:  Procedure: INCISION AND DRAINAGE;  Surgeon: Edie Norleen PARAS, MD;  Location: [facility] ORS;  Service: Orthopedics;                Laterality: Left; 2010: TONSILLECTOMY; Bilateral BMI    Body Mass Index: 36.94 kg/m     Reproductive/Obstetrics negative OB ROS                             Anesthesia Physical Anesthesia Plan  ASA: 2 and emergent  Anesthesia Plan: General ETT   Post-op Pain Management:    Induction:   PONV Risk Score  and Plan: 2  Airway Management Planned:   Additional Equipment:   Intra-op Plan:   Post-operative Plan:   Informed Consent: I have reviewed the patients History and Physical, chart, labs and discussed the procedure including the risks, benefits and alternatives for the proposed anesthesia with the patient or authorized representative who has indicated his/her understanding and acceptance.     Dental Advisory Given  Plan Discussed with: CRNA  Anesthesia Plan Comments:        Anesthesia Quick Evaluation

## 2023-03-25 NOTE — Consult Note (Signed)
 NAME: Ryan Chen  DOB: 2000-07-18  MRN: [id number]  Date/Time: 03/25/2023 12:24 PM  REQUESTING PROVIDER: Dr. Laurita Subjective:  REASON FOR CONSULT: MSSA bacteremia ? Ryan Chen is a 23 y.o. male with a history of intellectual disability is admitted to the hospital with painful swelling left forearm after mainly lying fentanyl  and crystal meth and missing the line and heating the soft tissue.  Patient had gone to [facility] and they told him that he needed IV antibiotics as a suspected necrotizing fasciitis.  Patient came to [facility] ED on 03/22/2023 Patient is adopted . The family does IV drugs .He was snorting from the age of 21 until 3 months ago when he started to [facility]. In the ED vitals were BP of 140/81, heart rate 125, temperature 97.8, sats 96 Percent. WBC was 7.8, Hb 13.3, platelet 290 and creatinine was 0.86. Blood cultures were sent He had CT scan of the left arm on that showed subcutaneous soft tissue edema of the elbow and the forearm with associated trace distal forearm emphysema.  Patient was started on triple antibiotics. Blood culture came back positive for MSSA Patient was also taken to the OR by Dr. Edie for I/D on 03/23/2023.  There was no evidence of necrotizing fasciitis.  There was some purulence which was washed out and cultures were sent.  Patient had a wound VAC.  He was taken back to the OR today and had further debridement and delayed primary closure.  A Penrose drain was left in place. The first surgical culture came back as Staph aureus as well. Patient is currently on cefazolin . I am seeing the patient for the Staph aureus bacteremia.  Past Medical History:  Diagnosis Date   ADHD (attention deficit hyperactivity disorder)    Depression    Headache(784.0)    Intellectual disability    PTSD (post-traumatic stress disorder)     Past Surgical History:  Procedure Laterality Date   APPLICATION OF WOUND VAC  03/23/2023   Procedure: APPLICATION OF WOUND  VAC;  Surgeon: Edie Norleen PARAS, MD;  Location: [facility] ORS;  Service: Orthopedics;;   CIRCUMCISION     INCISION AND DRAINAGE Left 03/23/2023   Procedure: INCISION AND DRAINAGE;  Surgeon: Edie Norleen PARAS, MD;  Location: [facility] ORS;  Service: Orthopedics;  Laterality: Left;   TONSILLECTOMY Bilateral 2010    Social History   Socioeconomic History   Marital status: Single    Spouse name: Not on file   Number of children: Not on file   Years of education: Not on file   Highest education level: Not on file  Occupational History   Not on file  Tobacco Use   Smoking status: Some Days    Types: Cigarettes   Smokeless tobacco: Never  Vaping Use   Vaping status: Every Day  Substance and Sexual Activity   Alcohol use: Not Currently   Drug use: Yes    Types: Cocaine    Comment: Last used 11/16/2022   Sexual activity: Never  Other Topics Concern   Not on file  Social History Narrative   Not on file   Social Drivers of Health   Financial Resource Strain: Not on file  Food Insecurity: Food Insecurity Present (03/23/2023)   Hunger Vital Sign    Worried About Running Out of Food in the Last Year: Sometimes true    Ran Out of Food in the Last Year: Sometimes true  Transportation Needs: Unmet Transportation Needs (03/23/2023)   PRAPARE - Transportation  Lack of Transportation (Medical): Yes    Lack of Transportation (Non-Medical): Yes  Physical Activity: Not on file  Stress: Not on file  Social Connections: Not on file  Intimate Partner Violence: Patient Unable To Answer (03/23/2023)   Humiliation, Afraid, Rape, and Kick questionnaire    Fear of Current or Ex-Partner: Patient unable to answer    Emotionally Abused: Patient unable to answer    Physically Abused: Patient unable to answer    Sexually Abused: Patient unable to answer    Family History  Adopted: Yes  Problem Relation Age of Onset   Other Other        Fhx Heart Problems on Bio Father's Side   Allergies  Allergen Reactions    Amoxicillin Nausea And Vomiting    Ineffective .SABRAHas patient had a PCN reaction causing immediate rash, facial/tongue/throat swelling, SOB or lightheadedness with hypotension: No Has patient had a PCN reaction causing severe rash involving mucus membranes or skin necrosis: No Has patient had a PCN reaction that required hospitalization No Has patient had a PCN reaction occurring within the last 10 years: Yes If all of the above answers are NO, then may proceed with Cephalosporin use.    I? Current Facility-Administered Medications  Medication Dose Route Frequency Provider Last Rate Last Admin   acetaminophen  (TYLENOL ) tablet 325-650 mg  325-650 mg Oral Q6H PRN Poggi, John J, MD       ARIPiprazole  (ABILIFY ) tablet 15 mg  15 mg Oral Daily Poggi, John J, MD   15 mg at 03/24/23 1009   bisacodyl  (DULCOLAX) suppository 10 mg  10 mg Rectal Daily PRN Poggi, John J, MD       busPIRone  (BUSPAR ) tablet 15 mg  15 mg Oral TID Poggi, John J, MD   15 mg at 03/24/23 2217   ceFAZolin  (ANCEF ) IVPB 2g/100 mL premix  2 g Intravenous Q8H Hunt, Madison H, RPH 200 mL/hr at 03/25/23 0709 2 g at 03/25/23 0709   diphenhydrAMINE  (BENADRYL ) 12.5 MG/5ML elixir 12.5-25 mg  12.5-25 mg Oral Q4H PRN Poggi, John J, MD       docusate sodium  (COLACE) capsule 100 mg  100 mg Oral BID Poggi, John J, MD   100 mg at 03/24/23 2217   enoxaparin  (LOVENOX ) injection 40 mg  40 mg Subcutaneous Q24H Poggi, John J, MD   40 mg at 03/24/23 9161   escitalopram  (LEXAPRO ) tablet 10 mg  10 mg Oral Daily Poggi, Norleen PARAS, MD   10 mg at 03/24/23 9161   loratadine  (CLARITIN ) tablet 10 mg  10 mg Oral Daily Poggi, Norleen PARAS, MD   10 mg at 03/24/23 9162   magnesium  hydroxide (MILK OF MAGNESIA) suspension 30 mL  30 mL Oral Daily PRN Poggi, Norleen PARAS, MD       metoCLOPramide  (REGLAN ) tablet 5-10 mg  5-10 mg Oral Q8H PRN Poggi, John J, MD       Or   metoCLOPramide  (REGLAN ) injection 5-10 mg  5-10 mg Intravenous Q8H PRN Poggi, John J, MD       ondansetron   (ZOFRAN ) tablet 4 mg  4 mg Oral Q6H PRN Poggi, John J, MD       Or   ondansetron  (ZOFRAN ) injection 4 mg  4 mg Intravenous Q6H PRN Poggi, John J, MD       oxyCODONE -acetaminophen  (PERCOCET/ROXICET) 5-325 MG per tablet 1 tablet  1 tablet Oral Q4H PRN Laurita Pillion, MD   1 tablet at 03/24/23 1529   sodium chloride  (OCEAN) 0.65 %  nasal spray 1 spray  1 spray Each Nare PRN Laurita Pillion, MD   1 spray at 03/24/23 0618   sodium phosphate  (FLEET) enema 1 enema  1 enema Rectal Once PRN Poggi, Norleen PARAS, MD       traZODone  (DESYREL ) tablet 150 mg  150 mg Oral QHS Poggi, John J, MD   150 mg at 03/24/23 2217     Abtx:  Anti-infectives (From admission, onward)    Start     Dose/Rate Route Frequency Ordered Stop   03/23/23 2200  ceFAZolin  (ANCEF ) IVPB 2g/100 mL premix        2 g 200 mL/hr over 30 Minutes Intravenous Every 8 hours 03/23/23 2023 04/02/23 2159   03/23/23 1200  vancomycin  (VANCOREADY) IVPB 1750 mg/350 mL  Status:  Discontinued        1,750 mg 175 mL/hr over 120 Minutes Intravenous Every 8 hours 03/22/23 2350 03/23/23 2009   03/22/23 2330  vancomycin  (VANCOREADY) IVPB 2000 mg/400 mL        2,000 mg 200 mL/hr over 120 Minutes Intravenous  Once 03/22/23 2315 03/23/23 0537   03/22/23 2330  meropenem  (MERREM ) 1 g in sodium chloride  0.9 % 100 mL IVPB  Status:  Discontinued        1 g 200 mL/hr over 30 Minutes Intravenous Every 8 hours 03/22/23 2317 03/23/23 2009   03/22/23 2315  vancomycin  (VANCOCIN ) IVPB 1000 mg/200 mL premix  Status:  Discontinued        1,000 mg 200 mL/hr over 60 Minutes Intravenous  Once 03/22/23 2305 03/22/23 2315   03/22/23 2315  meropenem  (MERREM ) 1 g in sodium chloride  0.9 % 100 mL IVPB  Status:  Discontinued        1 g 200 mL/hr over 30 Minutes Intravenous  Once 03/22/23 2305 03/22/23 2317   03/22/23 2145  vancomycin  (VANCOCIN ) IVPB 1000 mg/200 mL premix  Status:  Discontinued        1,000 mg 200 mL/hr over 60 Minutes Intravenous  Once 03/22/23 2130 03/22/23 2315    03/22/23 2145  metroNIDAZOLE  (FLAGYL ) IVPB 500 mg        500 mg 100 mL/hr over 60 Minutes Intravenous  Once 03/22/23 2130 03/23/23 0106   03/22/23 2145  levofloxacin  (LEVAQUIN ) IVPB 750 mg  Status:  Discontinued        750 mg 100 mL/hr over 90 Minutes Intravenous  Once 03/22/23 2130 03/23/23 0314       REVIEW OF SYSTEMS:  Const: negative fever, negative chills, negative weight loss Eyes: negative diplopia or visual changes, negative eye pain ENT: negative coryza, negative sore throat Resp: negative cough, hemoptysis, dyspnea Cards: negative for chest pain, palpitations, lower extremity edema GU: negative for frequency, dysuria and hematuria GI: Negative for abdominal pain, diarrhea, bleeding, constipation Skin: negative for rash and pruritus Heme: negative for easy bruising and gum/nose bleeding MS: Swelling, redness and pain left forearm  Neurolo:negative for headaches, dizziness, vertigo, memory problems  Psych: negative for feelings of anxiety, depression  Endocrine: negative for thyroid, diabetes Allergy/Immunology- negative for any medication or food allergies ? Pertinent Positives include : Objective:  VITALS:  BP (!) 158/80 (BP Location: Right Leg)   Pulse 78   Temp 97.7 F (36.5 C) (Oral)   Resp 16   Ht 6' 1 (1.854 m)   Wt 127 kg   SpO2 98%   BMI 36.94 kg/m   PHYSICAL EXAM:  General: Alert, cooperative, no distress, appears stated age.  Head: Normocephalic, without obvious  abnormality, atraumatic. Eyes: Conjunctivae clear, anicteric sclerae. Pupils are equal ENT Nares normal. No drainage or sinus tenderness. Lips, mucosa, and tongue normal. No Thrush Neck: Supple, symmetrical, no adenopathy, thyroid: non tender no carotid bruit and no JVD. Back: No CVA tenderness. Lungs: Clear to auscultation bilaterally. No Wheezing or Rhonchi. No rales. Heart: Regular rate and rhythm, no murmur, rub or gallop. Abdomen: Soft, non-tender,not distended. Bowel sounds normal.  No masses Extremities: Left forearm surgical dressing not removed. Skin: No rashes or lesions. Or bruising Lymph: Cervical, supraclavicular normal. Neurologic: Grossly non-focal Pertinent Labs Lab Results CBC    Component Value Date/Time   WBC 9.8 03/23/2023 1431   RBC 4.22 03/23/2023 1431   HGB 12.7 (L) 03/23/2023 1431   HCT 38.2 (L) 03/23/2023 1431   PLT 275 03/23/2023 1431   MCV 90.5 03/23/2023 1431   MCH 30.1 03/23/2023 1431   MCHC 33.2 03/23/2023 1431   RDW 12.4 03/23/2023 1431   LYMPHSABS 2.3 03/22/2023 2054   MONOABS 0.7 03/22/2023 2054   EOSABS 0.4 03/22/2023 2054   BASOSABS 0.0 03/22/2023 2054       Latest Ref Rng & Units 03/24/2023    4:46 AM 03/22/2023    8:54 PM 11/25/2022    5:16 PM  CMP  Glucose 70 - 99 mg/dL 862  93  95   BUN 6 - 20 mg/dL 25  18  22    Creatinine 0.61 - 1.24 mg/dL 9.17  9.13  9.11   Sodium 135 - 145 mmol/L 136  139  138   Potassium 3.5 - 5.1 mmol/L 4.4  3.7  3.6   Chloride 98 - 111 mmol/L 106  106  108   CO2 22 - 32 mmol/L 23  23  19    Calcium  8.9 - 10.3 mg/dL 9.1  9.3  89.7   Total Protein 6.5 - 8.1 g/dL 6.1  6.9  7.9   Total Bilirubin 0.0 - 1.2 mg/dL 0.4  0.3  1.2   Alkaline Phos 38 - 126 U/L 55  69  53   AST 15 - 41 U/L 19  20  24    ALT 0 - 44 U/L 17  24  19        Microbiology: Recent Results (from the past 240 hours)  Blood culture (routine x 2)     Status: Abnormal   Collection Time: 03/22/23  8:54 PM   Specimen: BLOOD  Result Value Ref Range Status   Specimen Description   Final    BLOOD RIGHT ANTECUBITAL Performed at [facility], [address], [place], [place] [address]    Special Requests   Final    BOTTLES DRAWN AEROBIC AND ANAEROBIC Blood Culture adequate volume Performed at [facility], [address] [place], [place] [address]    Culture  Setup Time   Final    GRAM POSITIVE COCCI ANAEROBIC BOTTLE ONLY CRITICAL RESULT CALLED TO, READ BACK BY AND VERIFIED WITH: MADISON HUNT @1936  03/23/23  LFD Performed at [facility], [address], [place], [place] [address]    Culture STAPHYLOCOCCUS AUREUS (A)  Final   Report Status 03/25/2023 FINAL  Final   Organism ID, Bacteria STAPHYLOCOCCUS AUREUS  Final      Susceptibility   Staphylococcus aureus - MIC*    CIPROFLOXACIN <=0.5 SENSITIVE Sensitive     ERYTHROMYCIN <=0.25 SENSITIVE Sensitive     GENTAMICIN <=0.5 SENSITIVE Sensitive     OXACILLIN <=0.25 SENSITIVE Sensitive     TETRACYCLINE <=1  SENSITIVE Sensitive     VANCOMYCIN  1 SENSITIVE Sensitive     TRIMETH/SULFA <=10 SENSITIVE Sensitive     CLINDAMYCIN  <=0.25 SENSITIVE Sensitive     RIFAMPIN <=0.5 SENSITIVE Sensitive     Inducible Clindamycin  NEGATIVE Sensitive     LINEZOLID 2 SENSITIVE Sensitive     * STAPHYLOCOCCUS AUREUS  Blood Culture ID Panel (Reflexed)     Status: Abnormal   Collection Time: 03/22/23  8:54 PM  Result Value Ref Range Status   Enterococcus faecalis NOT DETECTED NOT DETECTED Final   Enterococcus Faecium NOT DETECTED NOT DETECTED Final   Listeria monocytogenes NOT DETECTED NOT DETECTED Final   Staphylococcus species DETECTED (A) NOT DETECTED Final    Comment: CRITICAL RESULT CALLED TO, READ BACK BY AND VERIFIED WITH: Madison Hunt @1936  03/23/23 lfd    Staphylococcus aureus (BCID) DETECTED (A) NOT DETECTED Final    Comment: CRITICAL RESULT CALLED TO, READ BACK BY AND VERIFIED WITH: Madison Hunt @1936  03/23/2023 lfd    Staphylococcus epidermidis NOT DETECTED NOT DETECTED Final   Staphylococcus lugdunensis NOT DETECTED NOT DETECTED Final   Streptococcus species NOT DETECTED NOT DETECTED Final   Streptococcus agalactiae NOT DETECTED NOT DETECTED Final   Streptococcus pneumoniae NOT DETECTED NOT DETECTED Final   Streptococcus pyogenes NOT DETECTED NOT DETECTED Final   A.calcoaceticus-baumannii NOT DETECTED NOT DETECTED Final   Bacteroides fragilis NOT DETECTED NOT DETECTED Final   Enterobacterales NOT DETECTED NOT DETECTED Final   Enterobacter  cloacae complex NOT DETECTED NOT DETECTED Final   Escherichia coli NOT DETECTED NOT DETECTED Final   Klebsiella aerogenes NOT DETECTED NOT DETECTED Final   Klebsiella oxytoca NOT DETECTED NOT DETECTED Final   Klebsiella pneumoniae NOT DETECTED NOT DETECTED Final   Proteus species NOT DETECTED NOT DETECTED Final   Salmonella species NOT DETECTED NOT DETECTED Final   Serratia marcescens NOT DETECTED NOT DETECTED Final   Haemophilus influenzae NOT DETECTED NOT DETECTED Final   Neisseria meningitidis NOT DETECTED NOT DETECTED Final   Pseudomonas aeruginosa NOT DETECTED NOT DETECTED Final   Stenotrophomonas maltophilia NOT DETECTED NOT DETECTED Final   Candida albicans NOT DETECTED NOT DETECTED Final   Candida auris NOT DETECTED NOT DETECTED Final   Candida glabrata NOT DETECTED NOT DETECTED Final   Candida krusei NOT DETECTED NOT DETECTED Final   Candida parapsilosis NOT DETECTED NOT DETECTED Final   Candida tropicalis NOT DETECTED NOT DETECTED Final   Cryptococcus neoformans/gattii NOT DETECTED NOT DETECTED Final   Meth resistant mecA/C and MREJ NOT DETECTED NOT DETECTED Final    Comment: Performed at [facility], [address] [place], [place] [address]  Blood culture (routine x 2)     Status: None (Preliminary result)   Collection Time: 03/22/23  9:44 PM   Specimen: BLOOD  Result Value Ref Range Status   Specimen Description BLOOD RIGHT ARM  Final   Special Requests   Final    BOTTLES DRAWN AEROBIC AND ANAEROBIC Blood Culture results may not be optimal due to an inadequate volume of blood received in culture bottles   Culture   Final    NO GROWTH 3 DAYS Performed at [facility], [address] [place], [place] [address]    Report Status PENDING  Incomplete  Aerobic/Anaerobic Culture w Gram Stain (surgical/deep wound)     Status: None (Preliminary result)   Collection Time: 03/23/23 11:38 AM   Specimen: Wound; Abscess  Result Value Ref Range Status    Specimen Description   Final  WOUND Performed at [facility], [address] [place], [place] [address]    Special Requests LEFT FOREARM ABSCESS  Final   Gram Stain   Final    RARE WBC SEEN RARE GRAM POSITIVE COCCI Performed at [facility], [address], [place], [place] [address]    Culture MODERATE STAPHYLOCOCCUS AUREUS  Final   Report Status PENDING  Incomplete   Organism ID, Bacteria STAPHYLOCOCCUS AUREUS  Final      Susceptibility   Staphylococcus aureus - MIC*    CIPROFLOXACIN <=0.5 SENSITIVE Sensitive     ERYTHROMYCIN <=0.25 SENSITIVE Sensitive     GENTAMICIN <=0.5 SENSITIVE Sensitive     OXACILLIN <=0.25 SENSITIVE Sensitive     TETRACYCLINE <=1 SENSITIVE Sensitive     VANCOMYCIN  1 SENSITIVE Sensitive     TRIMETH/SULFA <=10 SENSITIVE Sensitive     CLINDAMYCIN  <=0.25 SENSITIVE Sensitive     RIFAMPIN <=0.5 SENSITIVE Sensitive     Inducible Clindamycin  NEGATIVE Sensitive     LINEZOLID 2 SENSITIVE Sensitive     * MODERATE STAPHYLOCOCCUS AUREUS  Culture, blood (Routine X 2) w Reflex to ID Panel     Status: None (Preliminary result)   Collection Time: 03/24/23  3:28 PM   Specimen: BLOOD  Result Value Ref Range Status   Specimen Description BLOOD BLOOD RIGHT ARM  Final   Special Requests   Final    BOTTLES DRAWN AEROBIC AND ANAEROBIC Blood Culture adequate volume   Culture   Final    NO GROWTH < 12 HOURS Performed at [facility], [address], [place], [place] [address]    Report Status PENDING  Incomplete  Culture, blood (Routine X 2) w Reflex to ID Panel     Status: None (Preliminary result)   Collection Time: 03/24/23  3:38 PM   Specimen: BLOOD  Result Value Ref Range Status   Specimen Description BLOOD BLOOD RIGHT HAND  Final   Special Requests   Final    BOTTLES DRAWN AEROBIC AND ANAEROBIC Blood Culture results may not be optimal due to an excessive volume of blood received in culture bottles   Culture   Final    NO GROWTH < 12  HOURS Performed at [facility], [address] [place], [place] [address]    Report Status PENDING  Incomplete    IMAGING RESULTS: CT scan of the left arm reviewed There is subcutaneous edema.  Of the forearm I have personally reviewed the films ? Impression/Recommendation Staph aureus bacteremia patient is currently on cefazolin  Will repeat blood culture Continue cefazolin  Need 2D echo and TEE to rule out endocarditis  Left forearm abscess and subcutaneous infection secondary to Staph aureus from mainlining of fentanyl  and crystal meth and missing the mark Status post I&D  HIV negative Will check hep C   Intellectual disability  Depression, PTSD  Polysubstance use  Discussed the management with the patient in great detail  ________________________________________________ Discussed with requesting provider

## 2023-03-25 NOTE — Progress Notes (Signed)
*  PRELIMINARY RESULTS* Echocardiogram 2D Echocardiogram has been performed.  Ryan Chen 03/25/2023, 11:01 AM

## 2023-03-25 NOTE — Progress Notes (Addendum)
  Progress Note   Patient: Ryan Chen FMW:[id number] DOB: 2000/04/13 DOA: 03/22/2023     3 DOS: the patient was seen and examined on 03/25/2023   Brief hospital course: Ryan Chen is a 23 y.o. Caucasian male with medical history significant for ADHD, depression, PTSD and polysubstance abuse including methamphetamine and fentanyl , who presented to the emergency room with a [name] of worsening left forearm erythema with induration, swelling and tenderness with pain.  Injected IV fentanyl  to the arm 3 days ago. CT scan showed soft tissue edema with trace forearm emphysema but no organized fluid collection.  Due to not able to rule out necrotizing fasciitis, orthopedics consult was obtained.  Patient is treated with meropenem  and vancomycin . Patient had I&D performed on 1/11, did not show evidence of necrotizing fasciitis.  Blood culture finalized with MSSA, wound culture still pending, antibiotic switched to cefazolin  on 1/11. TTE showed ejection fraction 50 to 55%, no valvular disease.  ID consult obtained on 1/13.    Principal Problem:   Cellulitis of left forearm Active Problems:   Polysubstance abuse (HCC)   Opiate abuse, continuous (HCC)   Staphylococcus aureus bacteremia   Assessment and Plan: * Cellulitis of left forearm with abscess secondary to Staph aureus. Staph aureus bacteremia. Patient is status post I&D, no evidence of necrotizing fasciitis.  Blood culture in bottle come back with Staph aureus, initially on meropenem  and vancomycin .  Changed to cefazolin , both wound culture and blood culture came back with MSSA. Consult from ID obtained, TTE did not show any vegetation with ejection fraction 50 to 55%.    Polysubstance abuse (HCC) - TOC was counseled for cessation IV fentanyl  and methamphetamine as well as vaping.   Anxiety and depression Continue home medicines.   Opiate abuse, continuous (HCC) Discussed with patient, he is not currently taking  methadone , he will check himself into rehab once discharged from hospital.   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 Diet exercise      Subjective:  Patient doing well today, pain under control.  No fever or chills.  No nausea vomiting.  Physical Exam: Vitals:   03/24/23 0434 03/24/23 0919 03/24/23 1801 03/25/23 0020  BP: 134/64 (!) 167/90 129/61 (!) 158/80  Pulse: 62 (!) 59 81 78  Resp: 20 16 18 16   Temp: 98 F (36.7 C) (!) 97.4 F (36.3 C) 98 F (36.7 C) 97.7 F (36.5 C)  TempSrc:  Oral Oral Oral  SpO2: 96% 100% 97% 98%  Weight:      Height:       General exam: Appears calm and comfortable, obese  Respiratory system: Clear to auscultation. Respiratory effort normal. Cardiovascular system: S1 & S2 heard, RRR. No JVD, murmurs, rubs, gallops or clicks. No pedal edema. Gastrointestinal system: Abdomen is nondistended, soft and nontender. No organomegaly or masses felt. Normal bowel sounds heard. Central nervous system: Alert and oriented. No focal neurological deficits. Extremities: Arm swelling improving. Skin: No rashes, lesions or ulcers Psychiatry: Judgement and insight appear normal. Mood & affect appropriate.    Data Reviewed:  Lab results reviewed.  Family Communication: None  Disposition: Status is: Inpatient Remains inpatient appropriate because: Severity of disease, IV treatment.     Time spent: 35 minutes  Author: Murvin Mana, MD 03/25/2023 12:20 PM  For on call review [email or web address].

## 2023-03-25 NOTE — Anesthesia Procedure Notes (Signed)
 Procedure Name: Intubation Date/Time: 03/25/2023 3:14 PM  Performed by: Gillermo Spruce I, CRNAPre-anesthesia Checklist: Patient identified, Emergency Drugs available, Suction available, Patient being monitored and Timeout performed Patient Re-evaluated:Patient Re-evaluated prior to induction Oxygen  Delivery Method: Circle system utilized Preoxygenation: Pre-oxygenation with 100% oxygen  Induction Type: IV induction Ventilation: Mask ventilation without difficulty Laryngoscope Size: McGrath and 4 Grade View: Grade I Tube type: Oral Tube size: 7.5 mm Number of attempts: 1 Airway Equipment and Method: Patient positioned with wedge pillow and Stylet Placement Confirmation: ETT inserted through vocal cords under direct vision, positive ETCO2, CO2 detector and breath sounds checked- equal and bilateral Secured at: 23 cm Tube secured with: Tape Dental Injury: Teeth and Oropharynx as per pre-operative assessment

## 2023-03-25 NOTE — Anesthesia Postprocedure Evaluation (Signed)
 Anesthesia Post Note  Patient: Ryan Chen  Procedure(s) Performed: REPEAT INCISION AND DRAINAGE, WITH PRIMARY CLOSURE (Left)  Patient location during evaluation: PACU Anesthesia Type: General Level of consciousness: awake and alert Pain management: pain level controlled Vital Signs Assessment: post-procedure vital signs reviewed and stable Respiratory status: spontaneous breathing, nonlabored ventilation and respiratory function stable Cardiovascular status: blood pressure returned to baseline and stable Postop Assessment: no apparent nausea or vomiting Anesthetic complications: no   No notable events documented.   Last Vitals:  Vitals:   03/25/23 1630 03/25/23 1721  BP: 138/83 134/79  Pulse: 77 62  Resp: 13 18  Temp:  (!) 36.3 C  SpO2: 100% 100%    Last Pain:  Vitals:   03/25/23 1721  TempSrc: Oral  PainSc:                  Camellia Merilee Louder

## 2023-03-25 NOTE — Consult Note (Signed)
 [facility] CARDIOLOGY CONSULT NOTE       Patient ID: Ryan Chen MRN: [id number] DOB/AGE: 11/12/2000 22 y.o.  Admit date: 03/22/2023 Referring Physician Dr. Laurita Primary Physician Valora Lynwood, MD  Primary Cardiologist None Reason for Consultation Rule out endocarditis  HPI: Ryan Chen is a 23 y.o. male  with a past medical history of hypertension, ADHD, depression, polysubstance use who presented to the ED on 03/22/2023 for left arm wound. Found to have MSSA bacteremia. Cardiology was consulted for further evaluation.   Patient reportedly used fentanyl  about 3 to 4 days ago and injected into his left arm.  Afterwards he developed an abscess to his left forearm and went to the ED at [facility] on 1/11 for further evaluation.  He left AMA and came to [facility] due to it being closer to his home for additional evaluation.  Workup in the ED notable for creatinine 0.86, potassium 3.7, hemoglobin 13.3, WBC 7.8.  Lactic acid 0.9.  Ultimately blood cultures were positive for methicillin-susceptible Staphylococcus aureus.  Recent UDS positive for amphetamines, cannabis, fentanyl .  CT of the left forearm demonstrated subcutaneous soft tissue edema with no fluid collection.  He was taken to the OR on 1/11 for I&D and again today for repeat.  At the time of my evaluation today he is resting comfortably in bed following I&D procedure.  He reports pain in his left arm and now has a drain in place.  He denies any chest pain, shortness of breath, palpitations.  He denies any prior cardiac history.  States that overall he has been doing well recently.  Otherwise has no complaints or concerns.  Review of systems complete and found to be negative unless listed above    Past Medical History:  Diagnosis Date   ADHD (attention deficit hyperactivity disorder)    Depression    Headache(784.0)    Intellectual disability    PTSD (post-traumatic stress disorder)     Past Surgical  History:  Procedure Laterality Date   APPLICATION OF WOUND VAC  03/23/2023   Procedure: APPLICATION OF WOUND VAC;  Surgeon: Edie Norleen PARAS, MD;  Location: [facility] ORS;  Service: Orthopedics;;   CIRCUMCISION     INCISION AND DRAINAGE Left 03/23/2023   Procedure: INCISION AND DRAINAGE;  Surgeon: Edie Norleen PARAS, MD;  Location: [facility] ORS;  Service: Orthopedics;  Laterality: Left;   TONSILLECTOMY Bilateral 2010    Medications Prior to Admission  Medication Sig Dispense Refill Last Dose/Taking   ABILIFY  MAINTENA 400 MG SRER injection Inject 400 mg into the muscle every 28 (twenty-eight) days.   Past Week   busPIRone  (BUSPAR ) 15 MG tablet Take 15 mg by mouth 3 (three) times daily.   Past Week   cetirizine (ZYRTEC) 10 MG tablet Take 10 mg by mouth daily.   Past Week   escitalopram  (LEXAPRO ) 10 MG tablet Take 1 tablet (10 mg total) by mouth daily. 30 tablet 2 Past Week   naloxone  (NARCAN ) nasal spray 4 mg/0.1 mL sign 2 each 0 Past Week   ondansetron  (ZOFRAN -ODT) 4 MG disintegrating tablet Take 1 tablet (4 mg total) by mouth every 6 (six) hours as needed for nausea or vomiting. 20 tablet 0 Taking As Needed   traZODone  (DESYREL ) 150 MG tablet Take 150 mg by mouth at bedtime.   Past Week   ARIPiprazole  (ABILIFY ) 15 MG tablet Take 1 tablet (15 mg total) by mouth daily. 30 tablet 2    methadone  (DOLOPHINE ) 10 MG/ML solution Take 60  mg by mouth daily. (Patient not taking: Reported on 03/23/2023)   Not Taking   Social History   Socioeconomic History   Marital status: Single    Spouse name: Not on file   Number of children: Not on file   Years of education: Not on file   Highest education level: Not on file  Occupational History   Not on file  Tobacco Use   Smoking status: Some Days    Types: Cigarettes   Smokeless tobacco: Never  Vaping Use   Vaping status: Every Day  Substance and Sexual Activity   Alcohol use: Not Currently   Drug use: Yes    Types: Cocaine    Comment: Last used 11/16/2022   Sexual  activity: Never  Other Topics Concern   Not on file  Social History Narrative   Not on file   Social Drivers of Health   Financial Resource Strain: Not on file  Food Insecurity: Food Insecurity Present (03/23/2023)   Hunger Vital Sign    Worried About Running Out of Food in the Last Year: Sometimes true    Ran Out of Food in the Last Year: Sometimes true  Transportation Needs: Unmet Transportation Needs (03/23/2023)   PRAPARE - Administrator, Civil Service (Medical): Yes    Lack of Transportation (Non-Medical): Yes  Physical Activity: Not on file  Stress: Not on file  Social Connections: Not on file  Intimate Partner Violence: Patient Unable To Answer (03/23/2023)   Humiliation, Afraid, Rape, and Kick questionnaire    Fear of Current or Ex-Partner: Patient unable to answer    Emotionally Abused: Patient unable to answer    Physically Abused: Patient unable to answer    Sexually Abused: Patient unable to answer    Family History  Adopted: Yes  Problem Relation Age of Onset   Other Other        Fhx Heart Problems on Bio Father's Side     Vitals:   03/24/23 0434 03/24/23 0919 03/24/23 1801 03/25/23 0020  BP: 134/64 (!) 167/90 129/61 (!) 158/80  Pulse: 62 (!) 59 81 78  Resp: 20 16 18 16   Temp: 98 F (36.7 C) (!) 97.4 F (36.3 C) 98 F (36.7 C) 97.7 F (36.5 C)  TempSrc:  Oral Oral Oral  SpO2: 96% 100% 97% 98%  Weight:      Height:        PHYSICAL EXAM General: A well-appearing male, well nourished, in no acute distress. HEENT: Normocephalic and atraumatic. Neck: No JVD.  Lungs: Normal respiratory effort on room air. Clear bilaterally to auscultation. No wheezes, crackles, rhonchi.  Heart: HRRR. Normal S1 and S2 without gallops or murmurs.  Abdomen: Non-distended appearing.  Msk: Normal strength and tone for age. Extremities: Warm and well perfused. No clubbing, cyanosis.  No edema.  Neuro: Alert and oriented X 3. Psych: Answers questions appropriately.    Labs: Basic Metabolic Panel: Recent Labs    03/22/23 2054 03/24/23 0446  NA 139 136  K 3.7 4.4  CL 106 106  CO2 23 23  GLUCOSE 93 137*  BUN 18 25*  CREATININE 0.86 0.82  CALCIUM  9.3 9.1  MG  --  2.1   Liver Function Tests: Recent Labs    03/22/23 2054 03/24/23 0446  AST 20 19  ALT 24 17  ALKPHOS 69 55  BILITOT 0.3 0.4  PROT 6.9 6.1*  ALBUMIN 3.7 3.1*   No results for input(s): LIPASE, AMYLASE in the last 72  hours. CBC: Recent Labs    03/22/23 2054 03/23/23 1431  WBC 7.8 9.8  NEUTROABS 4.3  --   HGB 13.3 12.7*  HCT 40.4 38.2*  MCV 91.4 90.5  PLT 290 275   Cardiac Enzymes: No results for input(s): CKTOTAL, CKMB, CKMBINDEX, TROPONINIHS in the last 72 hours. BNP: No results for input(s): BNP in the last 72 hours. D-Dimer: No results for input(s): DDIMER in the last 72 hours. Hemoglobin A1C: No results for input(s): HGBA1C in the last 72 hours. Fasting Lipid Panel: No results for input(s): CHOL, HDL, LDLCALC, TRIG, CHOLHDL, LDLDIRECT in the last 72 hours. Thyroid Function Tests: No results for input(s): TSH, T4TOTAL, T3FREE, THYROIDAB in the last 72 hours.  Invalid input(s): FREET3 Anemia Panel: No results for input(s): VITAMINB12, FOLATE, FERRITIN, TIBC, IRON, RETICCTPCT in the last 72 hours.   Radiology: ECHOCARDIOGRAM COMPLETE BUBBLE STUDY Result Date: 03/25/2023    ECHOCARDIOGRAM REPORT   Patient Name:   Ryan Chen Date of Exam: 03/25/2023 Medical Rec #:  [id number]          Height:       73.0 in Accession #:    [id number]         Weight:       280.0 lb Date of Birth:  2000-05-18           BSA:          2.482 m Patient Age:    22 years           BP:           158/80 mmHg Patient Gender: M                  HR:           65 bpm. Exam Location:  [facility] Procedure: 2D Echo, Cardiac Doppler and Color Doppler Indications:     Endocarditis  History:         Patient has no prior history of Echocardiogram  examinations.                  Endocarditis, Polysubstance abuse.  Sonographer:     Naomie Reef Referring Phys:  [id number] [facility] Diagnosing Phys: Ryan Chen  Sonographer Comments: Technically difficult study due to poor echo windows and patient is obese. No authorization for the use of Definity. IMPRESSIONS  1. Left ventricular ejection fraction, by estimation, is 50 to 55%. The left ventricle has low normal function. The left ventricle has no regional wall motion abnormalities. Left ventricular diastolic parameters were normal.  2. Right ventricular systolic function is normal. The right ventricular size is normal. There is normal pulmonary artery systolic pressure.  3. The mitral valve is normal in structure. Trivial mitral valve regurgitation.  4. The aortic valve is tricuspid. Aortic valve regurgitation is not visualized. Conclusion(s)/Recommendation(s): No obvious valvular vegetation noted. If high clinical suspicion for endocarditis, consider TEE for further evaluation. FINDINGS  Left Ventricle: Left ventricular ejection fraction, by estimation, is 50 to 55%. The left ventricle has low normal function. The left ventricle has no regional wall motion abnormalities. The left ventricular internal cavity size was normal in size. There is no left ventricular hypertrophy. Left ventricular diastolic parameters were normal. Right Ventricle: The right ventricular size is normal. No increase in right ventricular wall thickness. Right ventricular systolic function is normal. There is normal pulmonary artery systolic pressure. The tricuspid regurgitant velocity is 2.68 m/s, and  with an assumed right atrial pressure of 3  mmHg, the estimated right ventricular systolic pressure is 31.7 mmHg. Left Atrium: Left atrial size was normal in size. Right Atrium: Right atrial size was normal in size. Pericardium: There is no evidence of pericardial effusion. Mitral Valve: The mitral valve is normal in structure.  Trivial mitral valve regurgitation. MV peak gradient, 6.7 mmHg. The mean mitral valve gradient is 2.0 mmHg. Tricuspid Valve: The tricuspid valve is normal in structure. Tricuspid valve regurgitation is mild. Aortic Valve: The aortic valve is tricuspid. Aortic valve regurgitation is not visualized. Aortic valve mean gradient measures 3.0 mmHg. Aortic valve peak gradient measures 6.1 mmHg. Pulmonic Valve: The pulmonic valve was not well visualized. Pulmonic valve regurgitation is not visualized. Aorta: The aortic root is normal in size and structure. Venous: The inferior vena cava was not well visualized. IAS/Shunts: The interatrial septum was not well visualized.   Diastology LV e' medial:    13.20 cm/s LV E/e' medial:  8.7 LV e' lateral:   16.10 cm/s LV E/e' lateral: 7.1  RIGHT VENTRICLE RV Basal diam:  4.20 cm RV Mid diam:    3.80 cm TAPSE (M-mode): 2.5 cm LEFT ATRIUM             Index        RIGHT ATRIUM           Index LA Vol (A2C):   80.0 ml 32.23 ml/m  RA Area:     12.30 cm LA Vol (A4C):   54.8 ml 22.08 ml/m  RA Volume:   29.70 ml  11.96 ml/m LA Biplane Vol: 69.7 ml 28.08 ml/m  AORTIC VALVE                   PULMONIC VALVE AV Vmax:           123.00 cm/s PV Vmax:       0.94 m/s AV Vmean:          76.600 cm/s PV Peak grad:  3.5 mmHg AV VTI:            0.245 m AV Peak Grad:      6.1 mmHg AV Mean Grad:      3.0 mmHg LVOT Vmax:         102.00 cm/s LVOT Vmean:        49.900 cm/s LVOT VTI:          0.196 m LVOT/AV VTI ratio: 0.80 MITRAL VALVE                TRICUSPID VALVE MV Area (PHT): 3.01 cm     TR Peak grad:   28.7 mmHg MV Peak grad:  6.7 mmHg     TR Vmax:        268.00 cm/s MV Mean grad:  2.0 mmHg MV Vmax:       1.29 m/s     SHUNTS MV Vmean:      59.8 cm/s    Systemic VTI: 0.20 m MV Decel Time: 252 msec MV E velocity: 115.00 cm/s MV A velocity: 47.70 cm/s MV E/A ratio:  2.41 Ryan Chen Electronically signed by Ryan Chen Signature Date/Time: 03/25/2023/11:07:33 AM    Final    US  OR NERVE  BLOCK-IMAGE ONLY [facility]) Result Date: 03/23/2023 There is no interpretation for this exam.  This order is for images obtained during a surgical procedure.  Please See Surgeries Tab for more information regarding the procedure.   US  OR NERVE BLOCK-IMAGE ONLY [facility]) Result Date: 03/23/2023 There is no interpretation for  this exam.  This order is for images obtained during a surgical procedure.  Please See Surgeries Tab for more information regarding the procedure.   CT FOREARM LEFT W CONTRAST Result Date: 03/22/2023 CLINICAL DATA:  concern for nec fasc left forearm from IV drug use ongoing for 3 days. Patient seen at [facility] today for same and dx with necrotizing fasciti EXAM: CT OF THE UPPER LEFT EXTREMITY WITH CONTRAST TECHNIQUE: Multidetector CT imaging of the upper left extremity was performed according to the standard protocol following intravenous contrast administration. RADIATION DOSE REDUCTION: This exam was performed according to the departmental dose-optimization program which includes automated exposure control, adjustment of the mA and/or kV according to patient size and/or use of iterative reconstruction technique. CONTRAST:  OMNIPAQUE  IOHEXOL  300 MG/ML  SOLN COMPARISON:  None Available. FINDINGS: Bones/Joint/Cartilage No evidence of fracture, dislocation, or joint effusion. No evidence of severe arthropathy. No cortical erosion or destruction. No aggressive appearing focal bone abnormality. Ligaments Suboptimally assessed by CT. Muscles and Tendons Grossly unremarkable. Soft tissues Diffuse subcutaneus soft tissue edema of the visualized elbow and forearm most prominent along the dorsal aspect. Few foci of subcutaneus soft tissue gas along the medial distal forearm soft tissues (8:33). Overlying dermal thickening. No retained radiopaque foreign body. Vascular: Unremarkable. IMPRESSION: 1. Subcutaneus soft tissue edema of the elbow and forearm with associated trace distal forearm  emphysema. Overlying dermal thickening. No organized fluid collection. No retained radiopaque foreign body. Findings could represent cellulitis with necrotizing fasciitis is not excluded-please note this is a clinical diagnosis. 2.  Negative for acute osseous abnormality. Electronically Signed   By: Ryan  Chen M.D.   On: 03/22/2023 22:20    ECHO as above  TELEMETRY reviewed by me 03/25/2023: Sinus rhythm rate 80s  EKG reviewed by me: None for review this admission  Data reviewed by me 03/25/2023: last 24h vitals tele labs imaging I/O ED provider note, admission H&P  Principal Problem:   Cellulitis of left forearm Active Problems:   Polysubstance abuse (HCC)   Opiate abuse, continuous (HCC)   Staphylococcus aureus bacteremia    ASSESSMENT AND PLAN:  Kolson Chovanec is a 23 y.o. male  with a past medical history of hypertension, ADHD, depression, polysubstance use who presented to the ED on 03/22/2023 for left arm wound. Found to have MSSA bacteremia. Cardiology was consulted for further evaluation.   # MSSA bacteremia # L forearm cellulitis with abscess Patient with L arm wound s/p I&D x2 1/11 and 1/13. 2/2 IVDA with recent injection into arm. Blood & wound cultures positive for MSSA. TTE with EF 50-55%, no evidence of endocarditis.  -Plan for TEE tomorrow to rule out endocarditis. NPO at midnight.  -Further management per primary, ID.    This patient's plan of care was discussed and created with Dr. Wilburn and he is in agreement.  Signed: Danita Bloch, PA-C  03/25/2023, 1:56 PM [facility] Cardiology

## 2023-03-26 ENCOUNTER — Encounter: Payer: Self-pay | Admitting: Surgery

## 2023-03-26 ENCOUNTER — Encounter
Admission: EM | Disposition: A | Discharge status: Home / Self Care | Payer: Self-pay | Source: Home / Self Care | Attending: Internal Medicine

## 2023-03-26 ENCOUNTER — Inpatient Hospital Stay: Payer: MEDICAID | Admitting: Anesthesiology

## 2023-03-26 ENCOUNTER — Inpatient Hospital Stay
Admit: 2023-03-26 | Discharge: 2023-03-26 | Disposition: A | Discharge status: Home / Self Care | Payer: MEDICAID | Attending: Student | Admitting: Student

## 2023-03-26 DIAGNOSIS — L03114 Cellulitis of left upper limb: Secondary | ICD-10-CM | POA: Diagnosis not present

## 2023-03-26 DIAGNOSIS — L089 Local infection of the skin and subcutaneous tissue, unspecified: Secondary | ICD-10-CM | POA: Diagnosis not present

## 2023-03-26 DIAGNOSIS — F1910 Other psychoactive substance abuse, uncomplicated: Secondary | ICD-10-CM | POA: Diagnosis not present

## 2023-03-26 DIAGNOSIS — R7881 Bacteremia: Secondary | ICD-10-CM | POA: Diagnosis not present

## 2023-03-26 DIAGNOSIS — B9561 Methicillin susceptible Staphylococcus aureus infection as the cause of diseases classified elsewhere: Secondary | ICD-10-CM | POA: Diagnosis not present

## 2023-03-26 HISTORY — PX: TEE WITHOUT CARDIOVERSION: SHX5443

## 2023-03-26 LAB — BASIC METABOLIC PANEL
Anion gap: 6 (ref 5–15)
BUN: 16 mg/dL (ref 6–20)
CO2: 27 mmol/L (ref 22–32)
Calcium: 8.9 mg/dL (ref 8.9–10.3)
Chloride: 105 mmol/L (ref 98–111)
Creatinine, Ser: 0.72 mg/dL (ref 0.61–1.24)
GFR, Estimated: >60 mL/min (ref >60)
Glucose, Bld: 103 mg/dL — ABNORMAL HIGH (ref 70–99)
Potassium: 4.2 mmol/L (ref 3.5–5.1)
Sodium: 138 mmol/L (ref 135–145)

## 2023-03-26 LAB — CBC
HCT: 36.2 % — ABNORMAL LOW (ref 39.0–52.0)
Hemoglobin: 12.3 g/dL — ABNORMAL LOW (ref 13.0–17.0)
MCH: 30 pg (ref 26.0–34.0)
MCHC: 34 g/dL (ref 30.0–36.0)
MCV: 88.3 fL (ref 80.0–100.0)
Platelets: 291 10*3/uL (ref 150–400)
RBC: 4.1 MIL/uL — ABNORMAL LOW (ref 4.22–5.81)
RDW: 11.9 % (ref 11.5–15.5)
WBC: 10.3 10*3/uL (ref 4.0–10.5)
nRBC: 0 % (ref 0.0–0.2)

## 2023-03-26 LAB — HEPATITIS PANEL, ACUTE
HCV Ab: NONREACTIVE
Hep A IgM: NONREACTIVE
Hep B C IgM: NONREACTIVE
Hepatitis B Surface Ag: NONREACTIVE

## 2023-03-26 LAB — ECHO TEE

## 2023-03-26 SURGERY — ECHOCARDIOGRAM, TRANSESOPHAGEAL
Anesthesia: General

## 2023-03-26 MED ORDER — PROPOFOL 10 MG/ML IV BOLUS
INTRAVENOUS | Status: AC
  Filled 2023-03-26: qty 40

## 2023-03-26 MED ORDER — PROPOFOL 500 MG/50ML IV EMUL
INTRAVENOUS | Status: DC | PRN
  Administered 2023-03-26 (×2): 200 mg via INTRAVENOUS

## 2023-03-26 MED ORDER — LIDOCAINE VISCOUS HCL 2 % MT SOLN
OROMUCOSAL | Status: AC
  Filled 2023-03-26: qty 15

## 2023-03-26 MED ORDER — MUPIROCIN CALCIUM 2 % EX CREA
TOPICAL_CREAM | Freq: Two times a day (BID) | CUTANEOUS | Status: DC
  Filled 2023-03-26: qty 15

## 2023-03-26 MED ORDER — LIDOCAINE HCL (PF) 2 % IJ SOLN
INTRAMUSCULAR | Status: AC
Start: 2023-03-26 — End: ?
  Filled 2023-03-26: qty 5

## 2023-03-26 MED ORDER — KETAMINE HCL 50 MG/5ML IJ SOSY
PREFILLED_SYRINGE | INTRAMUSCULAR | Status: AC
  Filled 2023-03-26: qty 5

## 2023-03-26 MED ORDER — KETAMINE HCL 50 MG/5ML IJ SOSY
PREFILLED_SYRINGE | INTRAMUSCULAR | Status: DC | PRN
  Administered 2023-03-26: 50 mg via INTRAVENOUS

## 2023-03-26 MED ORDER — DEXMEDETOMIDINE HCL IN NACL 200 MCG/50ML IV SOLN
INTRAVENOUS | Status: DC | PRN
  Administered 2023-03-26 (×2): 10 ug via INTRAVENOUS

## 2023-03-26 MED ORDER — BUTAMBEN-TETRACAINE-BENZOCAINE 2-2-14 % EX AERO
INHALATION_SPRAY | CUTANEOUS | Status: AC
  Filled 2023-03-26: qty 5

## 2023-03-26 MED ORDER — LIDOCAINE HCL (CARDIAC) PF 100 MG/5ML IV SOSY
PREFILLED_SYRINGE | INTRAVENOUS | Status: DC | PRN
  Administered 2023-03-26: 100 mg via INTRAVENOUS

## 2023-03-26 NOTE — Anesthesia Preprocedure Evaluation (Signed)
 Anesthesia Evaluation  Patient identified by MRN, date of birth, ID band Patient awake    Reviewed: Allergy & Precautions, H&P , NPO status , Patient's Chart, lab work & pertinent test results, reviewed documented beta blocker date and time   Airway Mallampati: II  TM Distance: >3 FB Neck ROM: full    Dental  (+) Teeth Intact, Dental Advidsory Given   Pulmonary neg shortness of breath, neg COPD, neg recent URI, Current Smoker and Patient abstained from smoking.   Pulmonary exam normal        Cardiovascular Exercise Tolerance: Good negative cardio ROS Normal cardiovascular exam Rhythm:regular Rate:Normal     Neuro/Psych  Headaches, neg Seizures PSYCHIATRIC DISORDERS Anxiety Depression Bipolar Disorder      GI/Hepatic negative GI ROS, Neg liver ROS,,,  Endo/Other  negative endocrine ROS    Renal/GU negative Renal ROS  negative genitourinary   Musculoskeletal   Abdominal   Peds  Hematology negative hematology ROS (+)   Anesthesia Other Findings Past Medical History: No date: ADHD (attention deficit hyperactivity disorder) No date: Depression No date: Headache(784.0) No date: Intellectual disability No date: PTSD (post-traumatic stress disorder) Past Surgical History: 03/23/2023: APPLICATION OF WOUND VAC     Comment:  Procedure: APPLICATION OF WOUND VAC;  Surgeon: Edie Norleen PARAS, MD;  Location: [facility] ORS;  Service: Orthopedics;; No date: CIRCUMCISION 03/23/2023: INCISION AND DRAINAGE; Left     Comment:  Procedure: INCISION AND DRAINAGE;  Surgeon: Edie Norleen PARAS, MD;  Location: [facility] ORS;  Service: Orthopedics;                Laterality: Left; 2010: TONSILLECTOMY; Bilateral BMI    Body Mass Index: 36.94 kg/m     Reproductive/Obstetrics negative OB ROS                             Anesthesia Physical Anesthesia Plan  ASA: 2  Anesthesia Plan: General   Post-op  Pain Management:    Induction: Intravenous  PONV Risk Score and Plan: 2 and Propofol  infusion, TIVA and Treatment may vary due to age or medical condition  Airway Management Planned: Natural Airway and Simple Face Mask  Additional Equipment:   Intra-op Plan:   Post-operative Plan:   Informed Consent: I have reviewed the patients History and Physical, chart, labs and discussed the procedure including the risks, benefits and alternatives for the proposed anesthesia with the patient or authorized representative who has indicated his/her understanding and acceptance.     Dental Advisory Given  Plan Discussed with: CRNA  Anesthesia Plan Comments:        Anesthesia Quick Evaluation

## 2023-03-26 NOTE — Progress Notes (Signed)
 [facility] CLINIC CARDIOLOGY PROGRESS NOTE       Patient ID: Ryan Chen MRN: [id number] DOB/AGE: 11/30/2000 22 y.o.  Admit date: 03/22/2023 Referring Physician Dr. Laurita Primary Physician Valora Lynwood, MD  Primary Cardiologist None Reason for Consultation Rule out endocarditis  HPI: Ryan Chen is a 23 y.o. male  with a past medical history of hypertension, ADHD, depression, polysubstance use who presented to the ED on 03/22/2023 for left arm wound. Found to have MSSA bacteremia. Cardiology was consulted for further evaluation.   Interval history: -Patient seen and examined this afternoon. Feeling well overall.  -Denies any chest pain, SOB, palpitations.  -TEE today without evidence of endocarditis.  -BP and HR stable.   Review of systems complete and found to be negative unless listed above    Past Medical History:  Diagnosis Date   ADHD (attention deficit hyperactivity disorder)    Depression    Headache(784.0)    Intellectual disability    PTSD (post-traumatic stress disorder)     Past Surgical History:  Procedure Laterality Date   APPLICATION OF WOUND VAC  03/23/2023   Procedure: APPLICATION OF WOUND VAC;  Surgeon: Edie Norleen PARAS, MD;  Location: [facility] ORS;  Service: Orthopedics;;   CIRCUMCISION     INCISION AND DRAINAGE Left 03/23/2023   Procedure: INCISION AND DRAINAGE;  Surgeon: Edie Norleen PARAS, MD;  Location: [facility] ORS;  Service: Orthopedics;  Laterality: Left;   INCISION AND DRAINAGE Left 03/25/2023   Procedure: REPEAT INCISION AND DRAINAGE, WITH PRIMARY CLOSURE;  Surgeon: Edie Norleen PARAS, MD;  Location: [facility] ORS;  Service: Orthopedics;  Laterality: Left;   TONSILLECTOMY Bilateral 2010    Medications Prior to Admission  Medication Sig Dispense Refill Last Dose/Taking   ABILIFY  MAINTENA 400 MG SRER injection Inject 400 mg into the muscle every 28 (twenty-eight) days.   Past Week   busPIRone  (BUSPAR ) 15 MG tablet Take 15 mg by mouth 3 (three) times daily.   Past  Week   cetirizine (ZYRTEC) 10 MG tablet Take 10 mg by mouth daily.   Past Week   escitalopram  (LEXAPRO ) 10 MG tablet Take 1 tablet (10 mg total) by mouth daily. 30 tablet 2 Past Week   naloxone  (NARCAN ) nasal spray 4 mg/0.1 mL sign 2 each 0 Past Week   ondansetron  (ZOFRAN -ODT) 4 MG disintegrating tablet Take 1 tablet (4 mg total) by mouth every 6 (six) hours as needed for nausea or vomiting. 20 tablet 0 Taking As Needed   traZODone  (DESYREL ) 150 MG tablet Take 150 mg by mouth at bedtime.   Past Week   ARIPiprazole  (ABILIFY ) 15 MG tablet Take 1 tablet (15 mg total) by mouth daily. 30 tablet 2    methadone  (DOLOPHINE ) 10 MG/ML solution Take 60 mg by mouth daily. (Patient not taking: Reported on 03/23/2023)   Not Taking   Social History   Socioeconomic History   Marital status: Single    Spouse name: Not on file   Number of children: Not on file   Years of education: Not on file   Highest education level: Not on file  Occupational History   Not on file  Tobacco Use   Smoking status: Some Days    Types: Cigarettes   Smokeless tobacco: Never  Vaping Use   Vaping status: Every Day  Substance and Sexual Activity   Alcohol use: Not Currently   Drug use: Yes    Types: Cocaine    Comment: Last used 11/16/2022   Sexual activity: Never  Other Topics Concern   Not on file  Social History Narrative   Not on file   Social Drivers of Health   Financial Resource Strain: Not on file  Food Insecurity: Food Insecurity Present (03/23/2023)   Hunger Vital Sign    Worried About Running Out of Food in the Last Year: Sometimes true    Ran Out of Food in the Last Year: Sometimes true  Transportation Needs: Unmet Transportation Needs (03/23/2023)   PRAPARE - Administrator, Civil Service (Medical): Yes    Lack of Transportation (Non-Medical): Yes  Physical Activity: Not on file  Stress: Not on file  Social Connections: Not on file  Intimate Partner Violence: Patient Unable To Answer  (03/23/2023)   Humiliation, Afraid, Rape, and Kick questionnaire    Fear of Current or Ex-Partner: Patient unable to answer    Emotionally Abused: Patient unable to answer    Physically Abused: Patient unable to answer    Sexually Abused: Patient unable to answer    Family History  Adopted: Yes  Problem Relation Age of Onset   Other Other        Fhx Heart Problems on Bio Father's Side     Vitals:   03/25/23 2221 03/26/23 0000 03/26/23 0759 03/26/23 1047  BP: (!) 157/62 (!) 157/62 (!) 173/86 (!) 157/83  Pulse: 66 66 63 61  Resp: 18  17   Temp: (!) 97.5 F (36.4 C)  98.2 F (36.8 C)   TempSrc: Oral  Oral   SpO2: 95%  98% 100%  Weight:      Height:        PHYSICAL EXAM General: A well-appearing male, well nourished, in no acute distress. HEENT: Normocephalic and atraumatic. Neck: No JVD.  Lungs: Normal respiratory effort on room air. Clear bilaterally to auscultation. No wheezes, crackles, rhonchi.  Heart: HRRR. Normal S1 and S2 without gallops or murmurs.  Abdomen: Non-distended appearing.  Msk: Normal strength and tone for age. Extremities: Warm and well perfused. No clubbing, cyanosis.  No edema.  Neuro: Alert and oriented X 3. Psych: Answers questions appropriately.   Labs: Basic Metabolic Panel: Recent Labs    03/24/23 0446 03/26/23 0535  NA 136 138  K 4.4 4.2  CL 106 105  CO2 23 27  GLUCOSE 137* 103*  BUN 25* 16  CREATININE 0.82 0.72  CALCIUM  9.1 8.9  MG 2.1  --    Liver Function Tests: Recent Labs    03/24/23 0446  AST 19  ALT 17  ALKPHOS 55  BILITOT 0.4  PROT 6.1*  ALBUMIN 3.1*   No results for input(s): LIPASE, AMYLASE in the last 72 hours. CBC: Recent Labs    03/23/23 1431 03/26/23 0535  WBC 9.8 10.3  HGB 12.7* 12.3*  HCT 38.2* 36.2*  MCV 90.5 88.3  PLT 275 291   Cardiac Enzymes: No results for input(s): CKTOTAL, CKMB, CKMBINDEX, TROPONINIHS in the last 72 hours. BNP: No results for input(s): BNP in the last 72  hours. D-Dimer: No results for input(s): DDIMER in the last 72 hours. Hemoglobin A1C: No results for input(s): HGBA1C in the last 72 hours. Fasting Lipid Panel: No results for input(s): CHOL, HDL, LDLCALC, TRIG, CHOLHDL, LDLDIRECT in the last 72 hours. Thyroid Function Tests: No results for input(s): TSH, T4TOTAL, T3FREE, THYROIDAB in the last 72 hours.  Invalid input(s): FREET3 Anemia Panel: No results for input(s): VITAMINB12, FOLATE, FERRITIN, TIBC, IRON, RETICCTPCT in the last 72 hours.   Radiology: ECHOCARDIOGRAM COMPLETE  BUBBLE STUDY Result Date: 03/25/2023    ECHOCARDIOGRAM REPORT   Patient Name:   CANDY ZIEGLER [facility] Date of Exam: 03/25/2023 Medical Rec #:  [id number]          Height:       73.0 in Accession #:    [id number]         Weight:       280.0 lb Date of Birth:  2000-12-07           BSA:          2.482 m Patient Age:    22 years           BP:           158/80 mmHg Patient Gender: M                  HR:           65 bpm. Exam Location:  [facility] Procedure: 2D Echo, Cardiac Doppler and Color Doppler Indications:     Endocarditis  History:         Patient has no prior history of Echocardiogram examinations.                  Endocarditis, Polysubstance abuse.  Sonographer:     Naomie Reef Referring Phys:  [id number] [facility] Diagnosing Phys: Keller Paterson  Sonographer Comments: Technically difficult study due to poor echo windows and patient is obese. No authorization for the use of Definity. IMPRESSIONS  1. Left ventricular ejection fraction, by estimation, is 50 to 55%. The left ventricle has low normal function. The left ventricle has no regional wall motion abnormalities. Left ventricular diastolic parameters were normal.  2. Right ventricular systolic function is normal. The right ventricular size is normal. There is normal pulmonary artery systolic pressure.  3. The mitral valve is normal in structure. Trivial mitral valve regurgitation.  4. The  aortic valve is tricuspid. Aortic valve regurgitation is not visualized. Conclusion(s)/Recommendation(s): No obvious valvular vegetation noted. If high clinical suspicion for endocarditis, consider TEE for further evaluation. FINDINGS  Left Ventricle: Left ventricular ejection fraction, by estimation, is 50 to 55%. The left ventricle has low normal function. The left ventricle has no regional wall motion abnormalities. The left ventricular internal cavity size was normal in size. There is no left ventricular hypertrophy. Left ventricular diastolic parameters were normal. Right Ventricle: The right ventricular size is normal. No increase in right ventricular wall thickness. Right ventricular systolic function is normal. There is normal pulmonary artery systolic pressure. The tricuspid regurgitant velocity is 2.68 m/s, and  with an assumed right atrial pressure of 3 mmHg, the estimated right ventricular systolic pressure is 31.7 mmHg. Left Atrium: Left atrial size was normal in size. Right Atrium: Right atrial size was normal in size. Pericardium: There is no evidence of pericardial effusion. Mitral Valve: The mitral valve is normal in structure. Trivial mitral valve regurgitation. MV peak gradient, 6.7 mmHg. The mean mitral valve gradient is 2.0 mmHg. Tricuspid Valve: The tricuspid valve is normal in structure. Tricuspid valve regurgitation is mild. Aortic Valve: The aortic valve is tricuspid. Aortic valve regurgitation is not visualized. Aortic valve mean gradient measures 3.0 mmHg. Aortic valve peak gradient measures 6.1 mmHg. Pulmonic Valve: The pulmonic valve was not well visualized. Pulmonic valve regurgitation is not visualized. Aorta: The aortic root is normal in size and structure. Venous: The inferior vena cava was not well visualized. IAS/Shunts: The interatrial septum was not well visualized.   Diastology  LV e' medial:    13.20 cm/s LV E/e' medial:  8.7 LV e' lateral:   16.10 cm/s LV E/e' lateral: 7.1   RIGHT VENTRICLE RV Basal diam:  4.20 cm RV Mid diam:    3.80 cm TAPSE (M-mode): 2.5 cm LEFT ATRIUM             Index        RIGHT ATRIUM           Index LA Vol (A2C):   80.0 ml 32.23 ml/m  RA Area:     12.30 cm LA Vol (A4C):   54.8 ml 22.08 ml/m  RA Volume:   29.70 ml  11.96 ml/m LA Biplane Vol: 69.7 ml 28.08 ml/m  AORTIC VALVE                   PULMONIC VALVE AV Vmax:           123.00 cm/s PV Vmax:       0.94 m/s AV Vmean:          76.600 cm/s PV Peak grad:  3.5 mmHg AV VTI:            0.245 m AV Peak Grad:      6.1 mmHg AV Mean Grad:      3.0 mmHg LVOT Vmax:         102.00 cm/s LVOT Vmean:        49.900 cm/s LVOT VTI:          0.196 m LVOT/AV VTI ratio: 0.80 MITRAL VALVE                TRICUSPID VALVE MV Area (PHT): 3.01 cm     TR Peak grad:   28.7 mmHg MV Peak grad:  6.7 mmHg     TR Vmax:        268.00 cm/s MV Mean grad:  2.0 mmHg MV Vmax:       1.29 m/s     SHUNTS MV Vmean:      59.8 cm/s    Systemic VTI: 0.20 m MV Decel Time: 252 msec MV E velocity: 115.00 cm/s MV A velocity: 47.70 cm/s MV E/A ratio:  2.41 Keller Alluri Electronically signed by Keller Paterson Signature Date/Time: 03/25/2023/11:07:33 AM    Final    US  OR NERVE BLOCK-IMAGE ONLY [facility]) Result Date: 03/23/2023 There is no interpretation for this exam.  This order is for images obtained during a surgical procedure.  Please See Surgeries Tab for more information regarding the procedure.   US  OR NERVE BLOCK-IMAGE ONLY [facility]) Result Date: 03/23/2023 There is no interpretation for this exam.  This order is for images obtained during a surgical procedure.  Please See Surgeries Tab for more information regarding the procedure.   CT FOREARM LEFT W CONTRAST Result Date: 03/22/2023 CLINICAL DATA:  concern for nec fasc left forearm from IV drug use ongoing for 3 days. Patient seen at [facility] today for same and dx with necrotizing fasciti EXAM: CT OF THE UPPER LEFT EXTREMITY WITH CONTRAST TECHNIQUE: Multidetector CT imaging of the upper left  extremity was performed according to the standard protocol following intravenous contrast administration. RADIATION DOSE REDUCTION: This exam was performed according to the departmental dose-optimization program which includes automated exposure control, adjustment of the mA and/or kV according to patient size and/or use of iterative reconstruction technique. CONTRAST:  100mL OMNIPAQUE  IOHEXOL  300 MG/ML  SOLN COMPARISON:  None Available. FINDINGS: Bones/Joint/Cartilage No evidence of fracture, dislocation, or joint  effusion. No evidence of severe arthropathy. No cortical erosion or destruction. No aggressive appearing focal bone abnormality. Ligaments Suboptimally assessed by CT. Muscles and Tendons Grossly unremarkable. Soft tissues Diffuse subcutaneus soft tissue edema of the visualized elbow and forearm most prominent along the dorsal aspect. Few foci of subcutaneus soft tissue gas along the medial distal forearm soft tissues (8:33). Overlying dermal thickening. No retained radiopaque foreign body. Vascular: Unremarkable. IMPRESSION: 1. Subcutaneus soft tissue edema of the elbow and forearm with associated trace distal forearm emphysema. Overlying dermal thickening. No organized fluid collection. No retained radiopaque foreign body. Findings could represent cellulitis with necrotizing fasciitis is not excluded-please note this is a clinical diagnosis. 2.  Negative for acute osseous abnormality. Electronically Signed   By: Morgane  Naveau M.D.   On: 03/22/2023 22:20    ECHO as above  TELEMETRY reviewed by me 03/26/2023: sinus rhythm rate 60s  EKG reviewed by me: NSR rate 78 bpm  Data reviewed by me 03/26/2023: last 24h vitals tele labs imaging I/O hospitalist progress note  Principal Problem:   Cellulitis of left forearm Active Problems:   Polysubstance abuse (HCC)   Opiate abuse, continuous (HCC)   Staphylococcus aureus bacteremia    ASSESSMENT AND PLAN:  Alvin Diffee is a 23 y.o. male   with a past medical history of hypertension, ADHD, depression, polysubstance use who presented to the ED on 03/22/2023 for left arm wound. Found to have MSSA bacteremia. Cardiology was consulted for further evaluation.   # MSSA bacteremia # L forearm cellulitis with abscess Patient with L arm wound s/p I&D x2 1/11 and 1/13. 2/2 IVDA with recent injection into arm. Blood & wound cultures positive for MSSA. TTE with EF 50-55%, no evidence of endocarditis. TEE today without evidence of endocarditis.  -Stable from cardiac perspective.  -Further management per primary, ID.   Cardiology will sign off. Please haiku with questions or re-engage if needed.    This patient's plan of care was discussed and created with Dr. Wilburn and he is in agreement.  Signed: Danita Bloch, PA-C  03/26/2023, 1:00 PM [facility]

## 2023-03-26 NOTE — Progress Notes (Signed)
 Progress Note   Patient: Ryan Chen FMW:[id number] DOB: May 13, 2000 DOA: 03/22/2023     4 DOS: the patient was seen and examined on 03/26/2023   Brief hospital course: Ryan Chen is a 23 y.o. Caucasian male with medical history significant for ADHD, depression, PTSD and polysubstance abuse including methamphetamine and fentanyl , who presented to the emergency room with a [name] of worsening left forearm erythema with induration, swelling and tenderness with pain.  Injected IV fentanyl  to the arm 3 days ago. CT scan showed soft tissue edema with trace forearm emphysema but no organized fluid collection.  Due to not able to rule out necrotizing fasciitis, orthopedics consult was obtained.  Patient is treated with meropenem  and vancomycin . Patient had I&D performed on 1/11, did not show evidence of necrotizing fasciitis.  Blood culture finalized with MSSA, wound culture still pending, antibiotic switched to cefazolin  on 1/11. TTE showed ejection fraction 50 to 55%, no valvular disease.  ID consult obtained on 1/13. Repeated wound irrigation 1/13.     Principal Problem:   Cellulitis of left forearm Active Problems:   Polysubstance abuse (HCC)   Opiate abuse, continuous (HCC)   Staphylococcus aureus bacteremia   Assessment and Plan: * Cellulitis of left forearm with abscess secondary to Staph aureus. Staph aureus bacteremia. Patient is status post I&D, no evidence of necrotizing fasciitis.  Blood culture in bottle come back with Staph aureus, initially on meropenem  and vancomycin .  Changed to cefazolin , both wound culture and blood culture came back with MSSA. Consult from ID obtained, TTE did not show any vegetation with ejection fraction 50 to 55%. TEE pending.  Patient was brought to the OR again on 1/13, no further debridement needed, wound washed. Repeat blood culture sent out yesterday.  Patient may have to finish IV antibiotic in the hospital due to IV drug abuse.      Polysubstance abuse (HCC) - TOC was counseled for cessation IV fentanyl  and methamphetamine as well as vaping.   Anxiety and depression Continue home medicines.   Opiate abuse, continuous (HCC) Discussed with patient, he is not currently taking methadone , he will check himself into rehab once discharged from hospital.   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 Diet exercise      Subjective:  Doing well today, pain under control.  No nausea vomiting or fever.  Physical Exam: Vitals:   03/25/23 1721 03/25/23 2221 03/26/23 0000 03/26/23 0759  BP: 134/79 (!) 157/62 (!) 157/62 (!) 173/86  Pulse: 62 66 66 63  Resp: 18 18  17   Temp: (!) 97.4 F (36.3 C) (!) 97.5 F (36.4 C)  98.2 F (36.8 C)  TempSrc: Oral Oral  Oral  SpO2: 100% 95%  98%  Weight:      Height:       General exam: Appears calm and comfortable  Respiratory system: Clear to auscultation. Respiratory effort normal. Cardiovascular system: S1 & S2 heard, RRR. No JVD, murmurs, rubs, gallops or clicks. No pedal edema. Gastrointestinal system: Abdomen is nondistended, soft and nontender. No organomegaly or masses felt. Normal bowel sounds heard. Central nervous system: Alert and oriented. No focal neurological deficits. Extremities: Left arm swelling much improved. Skin: No rashes, lesions or ulcers Psychiatry: Judgement and insight appear normal. Mood & affect appropriate.    Data Reviewed:  Lab results reviewed.  Family Communication: None  Disposition: Status is: Inpatient Remains inpatient appropriate because: Severity of disease, IV treatment.     Time spent: 35 minutes  Author: Murvin Mana, MD  03/26/2023 10:41 AM  For on call review [email or web address].

## 2023-03-26 NOTE — Progress Notes (Signed)
 OT Cancellation Note  Patient Details Name: Ryan Chen MRN: [id number] DOB: 10/07/2000   Cancelled Treatment:    Reason Eval/Treat Not Completed: OT screened, no needs identified, will sign off. Pt at baseline for ADLs and mobility. Independent with toileting tasks in room. OT to complete orders. Please re-consult if new needs arise.  Jacque Garrels L. Adrean Findlay, OTR/L  03/26/23, 8:51 AM

## 2023-03-26 NOTE — Progress Notes (Signed)
 Date of Admission:  03/22/2023     ID: Ryan Chen is a 23 y.o. male Principal Problem:   Cellulitis of left forearm Active Problems:   Polysubstance abuse (HCC)   Opiate abuse, continuous (HCC)   Staphylococcus aureus bacteremia    Subjective: Pt is doing okay  Father at bedside ( not biological)  Medications:   acetaminophen   1,000 mg Oral Q6H   ARIPiprazole   15 mg Oral Daily   busPIRone   15 mg Oral TID   docusate sodium   100 mg Oral BID   enoxaparin  (LOVENOX ) injection  40 mg Subcutaneous Q24H   escitalopram   10 mg Oral Daily   ketorolac   15 mg Intravenous Q6H   loratadine   10 mg Oral Daily   traZODone   150 mg Oral QHS    Objective: Vital signs in last 24 hours: Patient Vitals for the past 24 hrs:  BP Temp Temp src Pulse Resp SpO2  03/26/23 1552 (!) 149/62 98 F (36.7 C) -- 74 18 96 %  03/26/23 1430 137/65 -- -- 69 16 93 %  03/26/23 1415 (!) 146/61 -- -- 77 18 92 %  03/26/23 1400 (!) 147/53 -- -- 65 20 99 %  03/26/23 1355 (!) 144/67 -- -- 67 14 99 %  03/26/23 1259 (!) 138/34 98.3 F (36.8 C) Oral 67 10 96 %  03/26/23 1047 (!) 157/83 -- -- 61 -- 100 %  03/26/23 0759 (!) 173/86 98.2 F (36.8 C) Oral 63 17 98 %  03/26/23 0000 (!) 157/62 -- -- 66 -- --  03/25/23 2221 (!) 157/62 (!) 97.5 F (36.4 C) Oral 66 18 95 %     PHYSICAL EXAM:  General: Alert, cooperative, no distress, appears stated age.  Lungs: Clear to auscultation bilaterally. No Wheezing or Rhonchi. No rales. Heart: Regular rate and rhythm, no murmur, rub or gallop. Abdomen: Soft, non-tender,not distended. Bowel sounds normal. No masses Extremities: rt calf wound- site of previous mainlining     Left forearm dressing not removed Lymph: Cervical, supraclavicular normal. Neurologic: Grossly non-focal  Lab Results    Latest Ref Rng & Units 03/26/2023    5:35 AM 03/23/2023    2:31 PM 03/22/2023    8:54 PM  CBC  WBC 4.0 - 10.5 K/uL 10.3  9.8  7.8   Hemoglobin 13.0 - 17.0 g/dL 87.6  87.2   86.6   Hematocrit 39.0 - 52.0 % 36.2  38.2  40.4   Platelets 150 - 400 K/uL 291  275  290        Latest Ref Rng & Units 03/26/2023    5:35 AM 03/24/2023    4:46 AM 03/22/2023    8:54 PM  CMP  Glucose 70 - 99 mg/dL 896  862  93   BUN 6 - 20 mg/dL 16  25  18    Creatinine 0.61 - 1.24 mg/dL 9.27  9.17  9.13   Sodium 135 - 145 mmol/L 138  136  139   Potassium 3.5 - 5.1 mmol/L 4.2  4.4  3.7   Chloride 98 - 111 mmol/L 105  106  106   CO2 22 - 32 mmol/L 27  23  23    Calcium  8.9 - 10.3 mg/dL 8.9  9.1  9.3   Total Protein 6.5 - 8.1 g/dL  6.1  6.9   Total Bilirubin 0.0 - 1.2 mg/dL  0.4  0.3   Alkaline Phos 38 - 126 U/L  55  69   AST 15 -  41 U/L  19  20   ALT 0 - 44 U/L  17  24       Microbiology: [facility] FROM 03/22/2023 MSSA Blood culture from 03/24/2023 so far no growth Wound culture from 03/23/2023 MSSA Studies/Results: ECHO TEE Result Date: 03/26/2023    TRANSESOPHOGEAL ECHO REPORT   Patient Name:   Ryan Chen [facility] Date of Exam: 03/26/2023 Medical Rec #:  [id number]          Height:       73.0 in Accession #:    [id number]         Weight:       280.0 lb Date of Birth:  2000/12/29           BSA:          2.482 m Patient Age:    22 years           BP:           138/74 mmHg Patient Gender: M                  HR:           67 bpm. Exam Location:  [facility] Procedure: Transesophageal Echo, Cardiac Doppler, Color Doppler and 3D Echo Indications:     Endocarditis  History:         Patient has prior history of Echocardiogram examinations, most                  recent 03/25/2023. Headache, PTSD.  Sonographer:     Christopher Furnace Referring Phys:  [id number] CARALYN HUDSON Diagnosing Phys: Keller Paterson PROCEDURE: After discussion of the risks and benefits of a TEE, an informed consent was obtained from the patient. The transesophogeal probe was passed without difficulty through the esophogus of the patient. Imaged were obtained with the patient in a supine position. Sedation performed by different physician. The patient was  monitored while under deep sedation. Image quality was excellent. The patient's vital signs; including heart rate, blood pressure, and oxygen  saturation; remained stable throughout the procedure. The patient developed no complications during the procedure.  IMPRESSIONS  1. Left ventricular ejection fraction, by estimation, is 50 to 55%. The left ventricle has normal function.  2. Right ventricular systolic function is normal. The right ventricular size is normal.  3. No left atrial/left atrial appendage thrombus was detected.  4. The mitral valve is normal in structure. Trivial mitral valve regurgitation.  5. The aortic valve is normal in structure. Aortic valve regurgitation is not visualized. Conclusion(s)/Recommendation(s): No evidence of vegetation/infective endocarditis on this transesophageael echocardiogram. FINDINGS  Left Ventricle: Left ventricular ejection fraction, by estimation, is 50 to 55%. The left ventricle has normal function. The left ventricular internal cavity size was normal in size. Right Ventricle: The right ventricular size is normal. No increase in right ventricular wall thickness. Right ventricular systolic function is normal. Left Atrium: Left atrial size was normal in size. No left atrial/left atrial appendage thrombus was detected. Right Atrium: Right atrial size was normal in size. Pericardium: There is no evidence of pericardial effusion. Mitral Valve: The mitral valve is normal in structure. Trivial mitral valve regurgitation. Tricuspid Valve: The tricuspid valve is normal in structure. Tricuspid valve regurgitation is trivial. Aortic Valve: The aortic valve is normal in structure. Aortic valve regurgitation is not visualized. Pulmonic Valve: The pulmonic valve was normal in structure. Pulmonic valve regurgitation is not visualized. Aorta: The aortic root is normal in size and structure. IAS/Shunts: No  atrial level shunt detected by color flow Doppler. Keller Paterson Electronically  signed by Keller Paterson Signature Date/Time: 03/26/2023/5:18:38 PM    Final    ECHOCARDIOGRAM COMPLETE BUBBLE STUDY Result Date: 03/25/2023    ECHOCARDIOGRAM REPORT   Patient Name:   ASHLEE BEWLEY [facility] Date of Exam: 03/25/2023 Medical Rec #:  [id number]          Height:       73.0 in Accession #:    [id number]         Weight:       280.0 lb Date of Birth:  Aug 27, 2000           BSA:          2.482 m Patient Age:    22 years           BP:           158/80 mmHg Patient Gender: M                  HR:           65 bpm. Exam Location:  [facility] Procedure: 2D Echo, Cardiac Doppler and Color Doppler Indications:     Endocarditis  History:         Patient has no prior history of Echocardiogram examinations.                  Endocarditis, Polysubstance abuse.  Sonographer:     Naomie Reef Referring Phys:  [id number] [facility] Diagnosing Phys: Keller Paterson  Sonographer Comments: Technically difficult study due to poor echo windows and patient is obese. No authorization for the use of Definity. IMPRESSIONS  1. Left ventricular ejection fraction, by estimation, is 50 to 55%. The left ventricle has low normal function. The left ventricle has no regional wall motion abnormalities. Left ventricular diastolic parameters were normal.  2. Right ventricular systolic function is normal. The right ventricular size is normal. There is normal pulmonary artery systolic pressure.  3. The mitral valve is normal in structure. Trivial mitral valve regurgitation.  4. The aortic valve is tricuspid. Aortic valve regurgitation is not visualized. Conclusion(s)/Recommendation(s): No obvious valvular vegetation noted. If high clinical suspicion for endocarditis, consider TEE for further evaluation. FINDINGS  Left Ventricle: Left ventricular ejection fraction, by estimation, is 50 to 55%. The left ventricle has low normal function. The left ventricle has no regional wall motion abnormalities. The left ventricular internal cavity size was normal in  size. There is no left ventricular hypertrophy. Left ventricular diastolic parameters were normal. Right Ventricle: The right ventricular size is normal. No increase in right ventricular wall thickness. Right ventricular systolic function is normal. There is normal pulmonary artery systolic pressure. The tricuspid regurgitant velocity is 2.68 m/s, and  with an assumed right atrial pressure of 3 mmHg, the estimated right ventricular systolic pressure is 31.7 mmHg. Left Atrium: Left atrial size was normal in size. Right Atrium: Right atrial size was normal in size. Pericardium: There is no evidence of pericardial effusion. Mitral Valve: The mitral valve is normal in structure. Trivial mitral valve regurgitation. MV peak gradient, 6.7 mmHg. The mean mitral valve gradient is 2.0 mmHg. Tricuspid Valve: The tricuspid valve is normal in structure. Tricuspid valve regurgitation is mild. Aortic Valve: The aortic valve is tricuspid. Aortic valve regurgitation is not visualized. Aortic valve mean gradient measures 3.0 mmHg. Aortic valve peak gradient measures 6.1 mmHg. Pulmonic Valve: The pulmonic valve was not well visualized. Pulmonic valve regurgitation is not visualized. Aorta:  The aortic root is normal in size and structure. Venous: The inferior vena cava was not well visualized. IAS/Shunts: The interatrial septum was not well visualized.   Diastology LV e' medial:    13.20 cm/s LV E/e' medial:  8.7 LV e' lateral:   16.10 cm/s LV E/e' lateral: 7.1  RIGHT VENTRICLE RV Basal diam:  4.20 cm RV Mid diam:    3.80 cm TAPSE (M-mode): 2.5 cm LEFT ATRIUM             Index        RIGHT ATRIUM           Index LA Vol (A2C):   80.0 ml 32.23 ml/m  RA Area:     12.30 cm LA Vol (A4C):   54.8 ml 22.08 ml/m  RA Volume:   29.70 ml  11.96 ml/m LA Biplane Vol: 69.7 ml 28.08 ml/m  AORTIC VALVE                   PULMONIC VALVE AV Vmax:           123.00 cm/s PV Vmax:       0.94 m/s AV Vmean:          76.600 cm/s PV Peak grad:  3.5 mmHg AV  VTI:            0.245 m AV Peak Grad:      6.1 mmHg AV Mean Grad:      3.0 mmHg LVOT Vmax:         102.00 cm/s LVOT Vmean:        49.900 cm/s LVOT VTI:          0.196 m LVOT/AV VTI ratio: 0.80 MITRAL VALVE                TRICUSPID VALVE MV Area (PHT): 3.01 cm     TR Peak grad:   28.7 mmHg MV Peak grad:  6.7 mmHg     TR Vmax:        268.00 cm/s MV Mean grad:  2.0 mmHg MV Vmax:       1.29 m/s     SHUNTS MV Vmean:      59.8 cm/s    Systemic VTI: 0.20 m MV Decel Time: 252 msec MV E velocity: 115.00 cm/s MV A velocity: 47.70 cm/s MV E/A ratio:  2.41 Keller Alluri Electronically signed by Keller Paterson Signature Date/Time: 03/25/2023/11:07:33 AM    Final      Assessment/Plan: Staph aureus bacteremia  patient is currently on cefazolin  repeat blood culture sent on 03/24/2023 so far negative TEE negative for endocarditis Continue cefazolin  for a minimum of 1 week IV until 03/30/2023 and then can be switched to oral antibiotics like cefadroxil  1 g p.o. every 12 for another 2 weeks    Left forearm abscess and subcutaneous infection secondary to Staph aureus from mainlining of fentanyl  and crystal meth and missing the mark Status post I&D  Right calf wound For topical mupirocin    HIV negative Hep C negative Hepatitis B surface antigen nonreactive     Intellectual disability  Depression, PTSD   Polysubstance use  Discussed the management with the patient and his father.

## 2023-03-26 NOTE — Transfer of Care (Signed)
 Immediate Anesthesia Transfer of Care Note  Patient: Ryan Chen  Procedure(s) Performed: TRANSESOPHAGEAL ECHOCARDIOGRAM (TEE)  Patient Location: PACU  Anesthesia Type:MAC  Level of Consciousness: awake and alert   Airway & Oxygen  Therapy: Patient Spontanous Breathing and Patient connected to nasal cannula oxygen   Post-op Assessment: Report given to RN and Post -op Vital signs reviewed and stable  Post vital signs: stable  Last Vitals:  Vitals Value Taken Time  BP 153/60   Temp 97   Pulse 70   Resp 25   SpO2 99     Last Pain:  Vitals:   03/26/23 1259  TempSrc: Oral  PainSc: 0-No pain      Patients Stated Pain Goal: 0 (03/25/23 1630)  Complications: No notable events documented.

## 2023-03-26 NOTE — Plan of Care (Signed)
  Problem: Education: Goal: Knowledge of General Education information will improve Description: Including pain rating scale, medication(s)/side effects and non-pharmacologic comfort measures Outcome: Progressing   Problem: Clinical Measurements: Goal: Ability to maintain clinical measurements within normal limits will improve Outcome: Progressing Goal: Cardiovascular complication will be avoided Outcome: Progressing   Problem: Activity: Goal: Risk for activity intolerance will decrease Outcome: Progressing   Problem: Nutrition: Goal: Adequate nutrition will be maintained Outcome: Progressing   Problem: Coping: Goal: Level of anxiety will decrease Outcome: Progressing   Problem: Safety: Goal: Ability to remain free from injury will improve Outcome: Progressing

## 2023-03-26 NOTE — TOC Progression Note (Signed)
 Transition of Care [facility]) - Progression Note    Patient Details  Name: Ryan Chen MRN: [id number] Date of Birth: January 09, 2001  Transition of Care [facility]) CM/SW Contact  Royanne JINNY Bernheim, RN Phone Number: 03/26/2023, 10:47 AM  Clinical Narrative:     The patient reports to the physician that they will check into rehab for Substance use at DC from hospital   Substance Use resources added to the chart to print at DC [facility] to sign off at this time, no additional needs identified      Expected Discharge Plan and Services                                               Social Determinants of Health (SDOH) Interventions SDOH Screenings   Food Insecurity: Food Insecurity Present (03/23/2023)  Housing: High Risk (03/23/2023)  Transportation Needs: Unmet Transportation Needs (03/23/2023)  Utilities: Patient Unable To Answer (03/23/2023)  Tobacco Use: High Risk (03/25/2023)    Readmission Risk Interventions     No data to display

## 2023-03-27 ENCOUNTER — Encounter: Payer: Self-pay | Admitting: Cardiology

## 2023-03-27 DIAGNOSIS — L03114 Cellulitis of left upper limb: Secondary | ICD-10-CM | POA: Diagnosis not present

## 2023-03-27 DIAGNOSIS — B9561 Methicillin susceptible Staphylococcus aureus infection as the cause of diseases classified elsewhere: Secondary | ICD-10-CM | POA: Diagnosis not present

## 2023-03-27 DIAGNOSIS — R7881 Bacteremia: Secondary | ICD-10-CM | POA: Diagnosis not present

## 2023-03-27 DIAGNOSIS — L089 Local infection of the skin and subcutaneous tissue, unspecified: Secondary | ICD-10-CM | POA: Diagnosis not present

## 2023-03-27 LAB — CBC
HCT: 34.7 % — ABNORMAL LOW (ref 39.0–52.0)
Hemoglobin: 11.6 g/dL — ABNORMAL LOW (ref 13.0–17.0)
MCH: 29.8 pg (ref 26.0–34.0)
MCHC: 33.4 g/dL (ref 30.0–36.0)
MCV: 89.2 fL (ref 80.0–100.0)
Platelets: 265 10*3/uL (ref 150–400)
RBC: 3.89 MIL/uL — ABNORMAL LOW (ref 4.22–5.81)
RDW: 11.9 % (ref 11.5–15.5)
WBC: 9.2 10*3/uL (ref 4.0–10.5)
nRBC: 0 % (ref 0.0–0.2)

## 2023-03-27 LAB — CULTURE, BLOOD (ROUTINE X 2): Culture: NO GROWTH

## 2023-03-27 MED ORDER — HYDRALAZINE HCL 20 MG/ML IJ SOLN: 10.0000 mg | Freq: Four times a day (QID) | INTRAMUSCULAR | Status: DC | PRN

## 2023-03-27 NOTE — Progress Notes (Signed)
 PROGRESS NOTE    Ryan Chen  WJX:[id number] DOB: 2000/09/04 DOA: 03/22/2023 PCP: Ryan San, MD  147A/147A-AA  LOS: 5 days   Brief hospital course:   Assessment & Plan: Ryan Chen is a 23 y.o. Caucasian male with medical history significant for ADHD, depression, PTSD and polysubstance abuse including methamphetamine and fentanyl , who presented to the emergency room with a Ryan Chen of worsening left forearm erythema with induration, swelling and tenderness with pain.  Injected IV fentanyl  to the arm 3 days ago. CT scan showed soft tissue edema with trace forearm emphysema but no organized fluid collection.  Due to not able to rule out necrotizing fasciitis, orthopedics consult was obtained.  Patient is treated with meropenem  and vancomycin . Patient had I&D performed on 1/11, did not show evidence of necrotizing fasciitis.  Blood culture finalized with MSSA, wound culture still pending, antibiotic switched to cefazolin  on 1/11. TTE showed ejection fraction 50 to 55%, no valvular disease.  ID consult obtained on 1/13. Repeated wound irrigation 1/13.   * Cellulitis of left forearm with abscess secondary to Staph aureus. Staph aureus bacteremia. Patient is status post I&D, no evidence of necrotizing fasciitis.  Blood culture in bottle come back with Staph aureus, initially on meropenem  and vancomycin .  Changed to cefazolin , both wound culture and blood culture came back with MSSA. Consult from ID obtained, TTE did not show any vegetation with ejection fraction 50 to 55%. TEE neg.  Patient was brought to the OR again on 1/13, no further debridement needed, wound washed. --cont cefazolin  for a minimum of 1 week IV until 03/30/2023 and then can be switched to oral antibiotics like cefadroxil  1 g p.o. every 12 for another 2 weeks    Polysubstance abuse (HCC) - TOC was counseled for cessation IV fentanyl  and methamphetamine as well as vaping.   Anxiety and depression --cont  Abilify , Buspar , Lexapro  and trazodone    Opiate abuse, continuous (HCC) Discussed with patient, he is not currently taking methadone , he will check himself into rehab once discharged from hospital.   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 Diet exercise  Elevated BP --no hx of HTN, not on antihypertensives PTA --IV hydralazine  PRN for now     DVT prophylaxis: Lovenox  SQ Code Status: Full code  Family Communication:  Level of care: Med-Surg Dispo:   The patient is from: home Anticipated d/c is to: home Anticipated d/c date is: 03/30/2023   Subjective and Interval History:  Normal oral intake.  No complaint.    Objective: Vitals:   03/26/23 2322 03/27/23 0033 03/27/23 0804 03/27/23 1541  BP: (!) 172/84 133/77 (!) 184/76 (!) 177/77  Pulse: 86 78 65 82  Resp: 18 18 16 18   Temp: 98.6 F (37 C)  98.4 F (36.9 C) 98.2 F (36.8 C)  TempSrc: Oral     SpO2: 98%  97% 98%  Weight:      Height:        Intake/Output Summary (Last 24 hours) at 03/27/2023 1912 Last data filed at 03/27/2023 1657 Gross per 24 hour  Intake 200 ml  Output 2750 ml  Net -2550 ml   Filed Weights   03/22/23 2040 03/25/23 1400  Weight: 127 kg 127 kg    Examination:   Constitutional: NAD, sleepy but arousable HEENT: conjunctivae and lids normal, EOMI CV: No cyanosis.   RESP: normal respiratory effort, on RA Neuro: II - XII grossly intact.   Psych: Normal mood and affect.     Data Reviewed: I  have personally reviewed labs and imaging studies  Time spent: 50 minutes  Ryan Kanner, MD [facility] If 7PM-7AM, please contact night-coverage 03/27/2023, 7:12 PM

## 2023-03-27 NOTE — Progress Notes (Signed)
 Date of Admission:  03/22/2023     ID: Ryan Chen is a 23 y.o. male Principal Problem:   Cellulitis of left forearm Active Problems:   Polysubstance abuse (HCC)   Opiate abuse, continuous (HCC)   Staphylococcus aureus bacteremia   Soft tissue infection    Subjective: Pt says he is doing fine  Medications:   ARIPiprazole   15 mg Oral Daily   busPIRone   15 mg Oral TID   docusate sodium   100 mg Oral BID   enoxaparin  (LOVENOX ) injection  40 mg Subcutaneous Q24H   escitalopram   10 mg Oral Daily   loratadine   10 mg Oral Daily   mupirocin  cream   Topical BID   traZODone   150 mg Oral QHS    Objective: Vital signs in last 24 hours: Patient Vitals for the past 24 hrs:  BP Temp Temp src Pulse Resp SpO2  03/27/23 1541 (!) 177/77 98.2 F (36.8 C) -- 82 18 98 %  03/27/23 0804 (!) 184/76 98.4 F (36.9 C) -- 65 16 97 %  03/27/23 0033 133/77 -- -- 78 18 --  03/26/23 2322 (!) 172/84 98.6 F (37 C) Oral 86 18 98 %     PHYSICAL EXAM:  General: Alert, cooperative, no distress, appears stated age.  Lungs: Clear to auscultation bilaterally. No Wheezing or Rhonchi. No rales. Heart: Regular rate and rhythm, no murmur, rub or gallop. Abdomen: Soft, non-tender,not distended. Bowel sounds normal. No masses Extremities: rt calf wound- site of previous mainlining     Left forearm dressing not removed Lymph: Cervical, supraclavicular normal. Neurologic: Grossly non-focal  Lab Results    Latest Ref Rng & Units 03/27/2023    3:53 AM 03/26/2023    5:35 AM 03/23/2023    2:31 PM  CBC  WBC 4.0 - 10.5 K/uL 9.2  10.3  9.8   Hemoglobin 13.0 - 17.0 g/dL 16.1  09.6  04.5   Hematocrit 39.0 - 52.0 % 34.7  36.2  38.2   Platelets 150 - 400 K/uL 265  291  275        Latest Ref Rng & Units 03/26/2023    5:35 AM 03/24/2023    4:46 AM 03/22/2023    8:54 PM  CMP  Glucose 70 - 99 mg/dL 409  811  93   BUN 6 - 20 mg/dL 16  25  18    Creatinine 0.61 - 1.24 mg/dL 9.14  7.82  9.56   Sodium 135 -  145 mmol/L 138  136  139   Potassium 3.5 - 5.1 mmol/L 4.2  4.4  3.7   Chloride 98 - 111 mmol/L 105  106  106   CO2 22 - 32 mmol/L 27  23  23    Calcium  8.9 - 10.3 mg/dL 8.9  9.1  9.3   Total Protein 6.5 - 8.1 g/dL  6.1  6.9   Total Bilirubin 0.0 - 1.2 mg/dL  0.4  0.3   Alkaline Phos 38 - 126 U/L  55  69   AST 15 - 41 U/L  19  20   ALT 0 - 44 U/L  17  24       Microbiology: [facility] FROM 03/22/2023 MSSA Blood culture from 03/24/2023 so far no growth Wound culture from 03/23/2023 MSSA Studies/Results: ECHO TEE Result Date: 03/26/2023    TRANSESOPHOGEAL ECHO REPORT   Patient Name:   Ryan Chen [facility] Date of Exam: 03/26/2023 Medical Rec #:  [id number]  Height:       73.0 in Accession #:    [id number]         Weight:       280.0 lb Date of Birth:  06-Jun-2000           BSA:          2.482 m Patient Age:    22 years           BP:           138/74 mmHg Patient Gender: M                  HR:           67 bpm. Exam Location:  [facility] Procedure: Transesophageal Echo, Cardiac Doppler, Color Doppler and 3D Echo Indications:     Endocarditis  History:         Patient has prior history of Echocardiogram examinations, most                  recent 03/25/2023. Headache, PTSD.  Sonographer:     Broadus Canes Referring Phys:  [id number] CARALYN HUDSON Diagnosing Phys: Joetta Mustache PROCEDURE: After discussion of the risks and benefits of a TEE, an informed consent was obtained from the patient. The transesophogeal probe was passed without difficulty through the esophogus of the patient. Imaged were obtained with the patient in a supine position. Sedation performed by different physician. The patient was monitored while under deep sedation. Image quality was excellent. The patient's vital signs; including heart rate, blood pressure, and oxygen  saturation; remained stable throughout the procedure. The patient developed no complications during the procedure.  IMPRESSIONS  1. Left ventricular ejection fraction, by estimation, is 50  to 55%. The left ventricle has normal function.  2. Right ventricular systolic function is normal. The right ventricular size is normal.  3. No left atrial/left atrial appendage thrombus was detected.  4. The mitral valve is normal in structure. Trivial mitral valve regurgitation.  5. The aortic valve is normal in structure. Aortic valve regurgitation is not visualized. Conclusion(s)/Recommendation(s): No evidence of vegetation/infective endocarditis on this transesophageael echocardiogram. FINDINGS  Left Ventricle: Left ventricular ejection fraction, by estimation, is 50 to 55%. The left ventricle has normal function. The left ventricular internal cavity size was normal in size. Right Ventricle: The right ventricular size is normal. No increase in right ventricular wall thickness. Right ventricular systolic function is normal. Left Atrium: Left atrial size was normal in size. No left atrial/left atrial appendage thrombus was detected. Right Atrium: Right atrial size was normal in size. Pericardium: There is no evidence of pericardial effusion. Mitral Valve: The mitral valve is normal in structure. Trivial mitral valve regurgitation. Tricuspid Valve: The tricuspid valve is normal in structure. Tricuspid valve regurgitation is trivial. Aortic Valve: The aortic valve is normal in structure. Aortic valve regurgitation is not visualized. Pulmonic Valve: The pulmonic valve was normal in structure. Pulmonic valve regurgitation is not visualized. Aorta: The aortic root is normal in size and structure. IAS/Shunts: No atrial level shunt detected by color flow Doppler. Joetta Mustache Electronically signed by Joetta Mustache Signature Date/Time: 03/26/2023/5:18:38 PM    Final      Assessment/Plan: Staph aureus bacteremia  patient is currently on cefazolin  repeat blood culture sent on 03/24/2023 so far negative TEE negative for endocarditis Continue cefazolin  for a minimum of 1 week IV until 03/30/2023 and then can be  switched to oral antibiotics like cefadroxil  1 g p.o. every 12 for another  2 weeks    Left forearm abscess and subcutaneous infection secondary to Staph aureus from mainlining of fentanyl  and crystal meth and missing the mark Status post I&D  Right calf wound For topical mupirocin    HIV negative Hep C negative Hepatitis B surface antigen nonreactive     Intellectual disability  Depression, PTSD   Polysubstance use  Discussed the management with the patient.

## 2023-03-27 NOTE — Plan of Care (Signed)
  Problem: Education: Goal: Knowledge of General Education information will improve Description: Including pain rating scale, medication(s)/side effects and non-pharmacologic comfort measures Outcome: Progressing   Problem: Clinical Measurements: Goal: Diagnostic test results will improve Outcome: Progressing   Problem: Activity: Goal: Risk for activity intolerance will decrease Outcome: Progressing   Problem: Nutrition: Goal: Adequate nutrition will be maintained Outcome: Progressing   Problem: Coping: Goal: Level of anxiety will decrease Outcome: Progressing   Problem: Safety: Goal: Ability to remain free from injury will improve Outcome: Progressing

## 2023-03-27 NOTE — Progress Notes (Signed)
  Subjective: POD2 Repeat I&D with delayed primary closure of left forearm wound  Patient reports pain as mild.   Patient is well, and has had no acute complaints or problems Plan is to go Home after hospital stay. Negative for chest pain and shortness of breath Fever: no Gastrointestinal:Negative for nausea and vomiting Reports he is urinating well this AM.  Objective: Vital signs in last 24 hours: Temp:  [98 F (36.7 C)-98.6 F (37 C)] 98.6 F (37 C) (01/14 2322) Pulse Rate:  [61-86] 78 (01/15 0033) Resp:  [10-20] 18 (01/15 0033) BP: (133-173)/(34-86) 133/77 (01/15 0033) SpO2:  [92 %-100 %] 98 % (01/14 2322)  Intake/Output from previous day:  Intake/Output Summary (Last 24 hours) at 03/27/2023 0703 Last data filed at 03/27/2023 0307 Gross per 24 hour  Intake 200 ml  Output 600 ml  Net -400 ml    Intake/Output this shift: No intake/output data recorded.  Labs: Recent Labs    03/26/23 0535 03/27/23 0353  HGB 12.3* 11.6*   Recent Labs    03/26/23 0535 03/27/23 0353  WBC 10.3 9.2  RBC 4.10* 3.89*  HCT 36.2* 34.7*  PLT 291 265   Recent Labs    03/26/23 0535  NA 138  K 4.2  CL 105  CO2 27  BUN 16  CREATININE 0.72  GLUCOSE 103*  CALCIUM  8.9   No results for input(s): "LABPT", "INR" in the last 72 hours.   EXAM General - Patient is Alert and Appropriate Extremity - Bulky dressing noted to the left arm. Dressing removed, incision healing well, no erythema or purulence noted. No drainage can be expressed. Swelling is improving to the left arm and hand. Increased flexion and extension in his fingers. Intact to light touch. Drain was removed yesterday evening.  Past Medical History:  Diagnosis Date   ADHD (attention deficit hyperactivity disorder)    Depression    Headache(784.0)    Intellectual disability    PTSD (post-traumatic stress disorder)     Assessment/Plan: 1 Day Post-Op Procedure(s) (LRB): TRANSESOPHAGEAL ECHOCARDIOGRAM (TEE)  (N/A) Principal Problem:   Cellulitis of left forearm Active Problems:   Polysubstance abuse (HCC)   Opiate abuse, continuous (HCC)   Staphylococcus aureus bacteremia   Soft tissue infection  Estimated body mass index is 36.94 kg/m as calculated from the following:   Height as of this encounter: 6\' 1"  (1.854 m).   Weight as of this encounter: 127 kg. Advance diet Up with therapy D/C IV fluids when tolerating po intake.  Labs and vitals reviewed, WBC trending down to 9.2.  No recent fevers. Drain was removed yesterday afternoon, wound healing well this AM. Continue IV Abx while admitted per ID. Following transition to oral Abx and discharge, follow-up with [facility] in 5-7 days for a skin check of the arm.  DVT Prophylaxis - Lovenox  and TED hose WBAT to the left arm.  Antoine Bathe, PA-C [facility] 03/27/2023, 7:03 AM

## 2023-03-28 DIAGNOSIS — L03114 Cellulitis of left upper limb: Secondary | ICD-10-CM | POA: Diagnosis not present

## 2023-03-28 LAB — AEROBIC/ANAEROBIC CULTURE W GRAM STAIN (SURGICAL/DEEP WOUND)

## 2023-03-28 MED ORDER — LOSARTAN POTASSIUM 50 MG PO TABS
50.0000 mg | ORAL_TABLET | Freq: Every day | ORAL | Status: DC
  Administered 2023-03-28 – 2023-03-29 (×2): 50 mg via ORAL
  Filled 2023-03-28 (×2): qty 1

## 2023-03-28 NOTE — Plan of Care (Signed)
  Problem: Education: Goal: Knowledge of General Education information will improve Description: Including pain rating scale, medication(s)/side effects and non-pharmacologic comfort measures Outcome: Progressing   Problem: Pain Management: Goal: General experience of comfort will improve Outcome: Progressing   Problem: Safety: Goal: Ability to remain free from injury will improve Outcome: Progressing   Problem: Skin Integrity: Goal: Risk for impaired skin integrity will decrease Outcome: Progressing

## 2023-03-28 NOTE — CV Procedure (Signed)
TEE report   1. Left ventricular ejection fraction, by estimation, is 50 to 55%. The  left ventricle has normal function.   2. Right ventricular systolic function is normal. The right ventricular  size is normal.   3. No left atrial/left atrial appendage thrombus was detected.   4. The mitral valve is normal in structure. Trivial mitral valve  regurgitation.   5. The aortic valve is normal in structure. Aortic valve regurgitation is  not visualized.   Conclusion(s)/Recommendation(s): No evidence of vegetation/infective  endocarditis on this transesophageael echocardiogram.

## 2023-03-28 NOTE — TOC Progression Note (Signed)
Transition of Care [facility]) - Progression Note    Patient Details  Name: Ryan Chen MRN: [id number] Date of Birth: 2000-07-29  Transition of Care [facility] CM/SW Contact  Marlowe Sax, RN Phone Number: 03/28/2023, 9:41 AM  Clinical Narrative:     Patient's mother called and said that the patient was planning to go to detox rehab and then to inpatient rehab, I explained once he is medically cleared to discharge the patient reports that he is planning to go to rehab, I will print the resources we have and place in the room for her,. She stated that they are already working with [facility] , I explained that they probably have other resoutces as well and encouraged her to continue working with [facility]       Expected Discharge Plan and Services                                               Social Determinants of Health (SDOH) Interventions SDOH Screenings   Food Insecurity: Food Insecurity Present (03/23/2023)  Housing: High Risk (03/23/2023)  Transportation Needs: Unmet Transportation Needs (03/23/2023)  Utilities: Patient Unable To Answer (03/23/2023)  Tobacco Use: High Risk (03/26/2023)    Readmission Risk Interventions     No data to display

## 2023-03-28 NOTE — Plan of Care (Signed)
  Problem: Health Behavior/Discharge Planning: Goal: Ability to manage health-related needs will improve Outcome: Progressing   Problem: Clinical Measurements: Goal: Ability to maintain clinical measurements within normal limits will improve Outcome: Progressing Goal: Diagnostic test results will improve Outcome: Progressing   Problem: Activity: Goal: Risk for activity intolerance will decrease Outcome: Progressing   Problem: Coping: Goal: Level of anxiety will decrease Outcome: Progressing   Problem: Safety: Goal: Ability to remain free from injury will improve Outcome: Progressing

## 2023-03-28 NOTE — Discharge Instructions (Addendum)
Intensive Outpatient Programs   [facility] [address] [place],  [place], [place] [phone]  [facility] (Inpatient and [phone] (Suboxone and Methadone) [address] [phone]  [facility] Programs - Intensive Outpatient [address] [place], [place] [address], [place]  [phone]  [facility] (Outpatient, Inpatient, [facility] (Groups and Residental) (insurance only) [phone] [place], [place] [phone]   [facility] (for caregivers and family) [address] Laurell Josephs [address] [place], [place] [phone]  Residential Treatment Programs  [facility](2 years) Residential: 56 days)[facility] [address] [place], [place], [place] [phone] or [phone]  [facility] [address] [place], [place], [place] [phone]  [facility] ([facility]) [address] [place], [place], [place] [phone] Admissions: 8am-3pm M-F  [facility]: Residential Program [phone] Days)             [facility]: [facility]  [place], [place], [place]  [phone] or [phone] in Hours over the weekend or by referral)  [facility] [address] [place], [place] [address] [phone] (Do virtual or phone assessment, offer transportation within 25 miles, have in patient and Outpatient options)   [facility] Crisis: [facility]:[phone] (for crisis  response 24 hours a day)   Keep incision dry and covered. Follow-up with [facility] orthopaedics next week for a skin check.

## 2023-03-28 NOTE — Progress Notes (Signed)
  Subjective: POD3 Repeat I&D with delayed primary closure of left forearm wound  Patient reports pain as mild. States that his left arm feels "fine".  Patient is well, and has had no acute complaints or problems Plan is to go Home after hospital stay. Negative for chest pain and shortness of breath Fever: no Gastrointestinal:Negative for nausea and vomiting Reports he is urinating well this AM.  Objective: Vital signs in last 24 hours: Temp:  [98.2 F (36.8 C)-98.8 F (37.1 C)] 98.8 F (37.1 C) (01/16 0818) Pulse Rate:  [57-82] 57 (01/16 0818) Resp:  [15-18] 15 (01/16 0818) BP: (177-185)/(72-82) 185/72 (01/16 0818) SpO2:  [98 %-100 %] 98 % (01/16 0818)  Intake/Output from previous day:  Intake/Output Summary (Last 24 hours) at 03/28/2023 1024 Last data filed at 03/28/2023 0549 Gross per 24 hour  Intake 300 ml  Output 3000 ml  Net -2700 ml    Intake/Output this shift: No intake/output data recorded.  Labs: Recent Labs    03/26/23 0535 03/27/23 0353  HGB 12.3* 11.6*   Recent Labs    03/26/23 0535 03/27/23 0353  WBC 10.3 9.2  RBC 4.10* 3.89*  HCT 36.2* 34.7*  PLT 291 265   Recent Labs    03/26/23 0535  NA 138  K 4.2  CL 105  CO2 27  BUN 16  CREATININE 0.72  GLUCOSE 103*  CALCIUM 8.9   No results for input(s): "LABPT", "INR" in the last 72 hours.   EXAM General - Patient is Alert and Appropriate Extremity - Bulky dressing noted to the left arm. Dressing removed, incision healing well, no erythema or purulence noted. No drainage can be expressed. Swelling has significantly improved to the left arm. Increased flexion and extension in his fingers.  Wrinkles are beginning to appear to the hand. Intact to light touch.  Past Medical History:  Diagnosis Date   ADHD (attention deficit hyperactivity disorder)    Depression    Headache(784.0)    Intellectual disability    PTSD (post-traumatic stress disorder)     Assessment/Plan: 2 Days Post-Op  Procedure(s) (LRB): TRANSESOPHAGEAL ECHOCARDIOGRAM (TEE) (N/A) Principal Problem:   Cellulitis of left forearm Active Problems:   Polysubstance abuse (HCC)   Opiate abuse, continuous (HCC)   Staphylococcus aureus bacteremia   Soft tissue infection  Estimated body mass index is 36.94 kg/m as calculated from the following:   Height as of this encounter: 6\' 1"  (1.854 m).   Weight as of this encounter: 127 kg. Advance diet Up with therapy D/C IV fluids when tolerating po intake.  Labs and vitals reviewed, WBC trending down to 9.2 yesterday..  No recent fevers. Incision is healing well, no erythema or purulence noted.   Continue IV Abx while admitted per ID.  Plan is for IV Abx until 03/30/23 and then transition to oral Abx. Following transition to oral Abx and discharge, follow-up with [facility] in 5-7 days for a skin check of the arm.  DVT Prophylaxis - Lovenox and TED hose WBAT to the left arm.  Valeria Batman, PA-C [facility] 03/28/2023, 10:24 AM

## 2023-03-28 NOTE — Progress Notes (Signed)
PROGRESS NOTE    Ryan Chen  HKV:[id number] DOB: January 28, 2001 DOA: 03/22/2023 PCP: Jerl Mina, MD  147A/[id number]-AA  LOS: 6 days   Brief hospital course:   Assessment & Plan: Ryan Chen is a 23 y.o. Caucasian male with medical history significant for ADHD, depression, PTSD and polysubstance abuse including methamphetamine and fentanyl, who presented to the emergency room with a [name] of worsening left forearm erythema with induration, swelling and tenderness with pain.  Injected IV fentanyl to the arm 3 days ago. CT scan showed soft tissue edema with trace forearm emphysema but no organized fluid collection.  Due to not able to rule out necrotizing fasciitis, orthopedics consult was obtained.  Patient is treated with meropenem and vancomycin. Patient had I&D performed on 1/11, did not show evidence of necrotizing fasciitis.  Blood culture finalized with MSSA, wound culture still pending, antibiotic switched to cefazolin on 1/11. TTE showed ejection fraction 50 to 55%, no valvular disease.  ID consult obtained on 1/13. Repeated wound irrigation 1/13.   * Cellulitis of left forearm with abscess secondary to Staph aureus. Staph aureus bacteremia. Patient is status post I&D, no evidence of necrotizing fasciitis.  Blood culture in bottle come back with Staph aureus, initially on meropenem and vancomycin.  Changed to cefazolin, both wound culture and blood culture came back with MSSA. Consult from ID obtained, TTE did not show any vegetation with ejection fraction 50 to 55%. TEE neg.  Patient was brought to the OR again on 1/13, no further debridement needed, wound washed. --cont cefazolin for a minimum of 1 week IV until 03/30/2023 and then can be switched to oral antibiotics like cefadroxil 1 g p.o. every 12 for another 2 weeks    Polysubstance abuse (HCC) - IV fentanyl and methamphetamine as well as vaping. --family looking into rehab   Anxiety and depression --cont  Abilify, Buspar, Lexapro and trazodone   Opiate abuse, continuous (HCC) Discussed with patient, he is not currently taking methadone, he will check himself into rehab once discharged from hospital.   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 Diet exercise  Elevated BP --no hx of HTN, not on antihypertensives PTA --start losartan 50 mg daily --IV hydralazine PRN      DVT prophylaxis: Lovenox SQ Code Status: Full code  Family Communication:  Level of care: Med-Surg Dispo:   The patient is from: home Anticipated d/c is to: home Anticipated d/c date is: 03/30/2023   Subjective and Interval History:  No complaint.  Pt said he has been out of bed.   Objective: Vitals:   03/27/23 1541 03/27/23 2238 03/28/23 0818 03/28/23 1546  BP: (!) 177/77 (!) 178/82 (!) 185/72 (!) 161/75  Pulse: 82 75 (!) 57 67  Resp: 18 18 15 16   Temp: 98.2 F (36.8 C) 98.6 F (37 C) 98.8 F (37.1 C) 98.5 F (36.9 C)  TempSrc:   Oral   SpO2: 98% 100% 98% 99%  Weight:      Height:        Intake/Output Summary (Last 24 hours) at 03/28/2023 1958 Last data filed at 03/28/2023 1145 Gross per 24 hour  Intake 600 ml  Output 2000 ml  Net -1400 ml   Filed Weights   03/22/23 2040 03/25/23 1400  Weight: 127 kg 127 kg    Examination:   Constitutional: NAD, AAOx3 HEENT: conjunctivae and lids normal, EOMI CV: No cyanosis.   RESP: normal respiratory effort, on RA Neuro: II - XII grossly intact.   Psych: Normal  mood and affect.  Appropriate judgement and reason   Data Reviewed: I have personally reviewed labs and imaging studies  Time spent: 35 minutes  Darlin Priestly, MD [facility] If 7PM-7AM, please contact night-coverage 03/28/2023, 7:58 PM

## 2023-03-29 DIAGNOSIS — L089 Local infection of the skin and subcutaneous tissue, unspecified: Secondary | ICD-10-CM | POA: Diagnosis not present

## 2023-03-29 DIAGNOSIS — L03114 Cellulitis of left upper limb: Secondary | ICD-10-CM | POA: Diagnosis not present

## 2023-03-29 DIAGNOSIS — B9561 Methicillin susceptible Staphylococcus aureus infection as the cause of diseases classified elsewhere: Secondary | ICD-10-CM | POA: Diagnosis not present

## 2023-03-29 DIAGNOSIS — R7881 Bacteremia: Secondary | ICD-10-CM | POA: Diagnosis not present

## 2023-03-29 LAB — CULTURE, BLOOD (ROUTINE X 2)
Culture: NO GROWTH
Culture: NO GROWTH
Special Requests: ADEQUATE

## 2023-03-29 MED ORDER — HYDROXYZINE HCL 50 MG PO TABS: 50.0000 mg | ORAL_TABLET | Freq: Every evening | ORAL | Status: DC | PRN

## 2023-03-29 MED ORDER — LOSARTAN POTASSIUM 50 MG PO TABS
50.0000 mg | ORAL_TABLET | Freq: Once | ORAL | Status: AC
  Administered 2023-03-29: 50 mg via ORAL
  Filled 2023-03-29: qty 1

## 2023-03-29 MED ORDER — ROPINIROLE HCL 1 MG PO TABS
0.5000 mg | ORAL_TABLET | Freq: Two times a day (BID) | ORAL | Status: DC | PRN
  Administered 2023-03-29: 0.5 mg via ORAL
  Filled 2023-03-29: qty 1

## 2023-03-29 MED ORDER — METHADONE HCL 10 MG PO TABS
20.0000 mg | ORAL_TABLET | Freq: Every day | ORAL | Status: DC
  Administered 2023-03-29: 20 mg via ORAL
  Filled 2023-03-29: qty 2

## 2023-03-29 MED ORDER — LOSARTAN POTASSIUM 50 MG PO TABS: 100.0000 mg | ORAL_TABLET | Freq: Every day | ORAL | Status: DC

## 2023-03-29 NOTE — Progress Notes (Signed)
PROGRESS NOTE    Ryan Chen  ZOX:[id number] DOB: 01/31/2001 DOA: 03/22/2023 PCP: Jerl Mina, MD  147A/[id number]-AA  LOS: 7 days   Brief hospital course:   Assessment & Plan: Ryan Chen is a 23 y.o. Caucasian male with medical history significant for ADHD, depression, PTSD and polysubstance abuse including methamphetamine and fentanyl, who presented to the emergency room with a [name] of worsening left forearm erythema with induration, swelling and tenderness with pain.  Injected IV fentanyl to the arm 3 days ago. CT scan showed soft tissue edema with trace forearm emphysema but no organized fluid collection.  Due to not able to rule out necrotizing fasciitis, orthopedics consult was obtained.  Patient is treated with meropenem and vancomycin. Patient had I&D performed on 1/11, did not show evidence of necrotizing fasciitis.  Blood culture finalized with MSSA, wound culture still pending, antibiotic switched to cefazolin on 1/11. TTE showed ejection fraction 50 to 55%, no valvular disease.  ID consult obtained on 1/13. Repeated wound irrigation 1/13.   * Cellulitis of left forearm with abscess secondary to Staph aureus. Staph aureus bacteremia. Patient is status post I&D, no evidence of necrotizing fasciitis.  Blood culture in bottle come back with Staph aureus, initially on meropenem and vancomycin.  Changed to cefazolin, both wound culture and blood culture came back with MSSA. Consult from ID obtained, TTE did not show any vegetation with ejection fraction 50 to 55%. TEE neg.  Patient was brought to the OR again on 1/13, no further debridement needed, wound washed. --cont cefazolin for a minimum of 1 week IV until 03/30/2023 and then can be switched to oral antibiotics like cefadroxil 1 g p.o. every 12 for another 2 weeks    Polysubstance abuse (HCC) - IV fentanyl and methamphetamine as well as vaping. --plan to enter rehab on Tuesday   Anxiety and depression --cont  Abilify, Buspar, Lexapro and trazodone   Opiate abuse, continuous (HCC) Discussed with patient, he is not currently taking methadone, he will check himself into rehab once discharged from hospital. 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 Diet exercise  Elevated BP --no hx of HTN, not on antihypertensives PTA --increase losartan to 100 mg daily (new) --IV hydralazine PRN      DVT prophylaxis: Lovenox SQ Code Status: Full code  Family Communication:  Level of care: Med-Surg Dispo:   The patient is from: home Anticipated d/c is to: home Anticipated d/c date is: 03/30/2023   Subjective and Interval History:  Pt reported having opioid withdrawal symptoms today, including diarrhea and restless leg.   Objective: Vitals:   03/28/23 1546 03/28/23 2212 03/29/23 0827 03/29/23 1500  BP: (!) 161/75 (!) 179/76 (!) 165/78 (!) 162/81  Pulse: 67 (!) 58 69 (!) 57  Resp: 16 18 16 20   Temp: 98.5 F (36.9 C) 98.2 F (36.8 C) 98.6 F (37 C) 98.4 F (36.9 C)  TempSrc:      SpO2: 99% 99% 98% 99%  Weight:      Height:        Intake/Output Summary (Last 24 hours) at 03/29/2023 2005 Last data filed at 03/29/2023 1053 Gross per 24 hour  Intake 120 ml  Output --  Net 120 ml   Filed Weights   03/22/23 2040 03/25/23 1400  Weight: 127 kg 127 kg    Examination:   Constitutional: NAD, AAOx3 HEENT: conjunctivae and lids normal, EOMI CV: No cyanosis.   RESP:  normal respiratory effort, on RA Neuro: II - XII grossly intact.   Psych: Normal mood and affect.     Data Reviewed: I have personally reviewed labs and imaging studies  Time spent: 35 minutes  Darlin Priestly, MD [facility] If 7PM-7AM, please contact night-coverage 03/29/2023, 8:05 PM

## 2023-03-29 NOTE — Plan of Care (Signed)
  Problem: Education: Goal: Knowledge of General Education information will improve Description: Including pain rating scale, medication(s)/side effects and non-pharmacologic comfort measures Outcome: Progressing   Problem: Clinical Measurements: Goal: Ability to maintain clinical measurements within normal limits will improve Outcome: Progressing   Problem: Nutrition: Goal: Adequate nutrition will be maintained Outcome: Progressing   Problem: Activity: Goal: Risk for activity intolerance will decrease Outcome: Progressing   Problem: Coping: Goal: Level of anxiety will decrease Outcome: Progressing

## 2023-03-29 NOTE — Anesthesia Postprocedure Evaluation (Signed)
Anesthesia Post Note  Patient: Ryan Chen  Procedure(s) Performed: TRANSESOPHAGEAL ECHOCARDIOGRAM (TEE)  Patient location during evaluation: Specials Recovery Anesthesia Type: General Level of consciousness: awake and alert Pain management: pain level controlled Vital Signs Assessment: post-procedure vital signs reviewed and stable Respiratory status: spontaneous breathing, nonlabored ventilation, respiratory function stable and patient connected to nasal cannula oxygen Cardiovascular status: blood pressure returned to baseline and stable Postop Assessment: no apparent nausea or vomiting Anesthetic complications: no   No notable events documented.   Last Vitals:  Vitals:   03/28/23 2212 03/29/23 0827  BP: (!) 179/76 (!) 165/78  Pulse: (!) 58 69  Resp: 18 16  Temp: 36.8 C 37 C  SpO2: 99% 98%    Last Pain:  Vitals:   03/29/23 1200  TempSrc:   PainSc: 0-No pain                 Lenard Simmer

## 2023-03-29 NOTE — Plan of Care (Signed)

## 2023-03-29 NOTE — Progress Notes (Signed)
  Subjective: POD4 Repeat I&D with delayed primary closure of left forearm wound  Patient reports pain as mild. States that his left arm feels "fine".  Patient is well, and has had no acute complaints or problems Plan is to go Home after hospital stay. Negative for chest pain and shortness of breath Fever: no Gastrointestinal:Negative for nausea and vomiting Reports he is urinating well this AM.  Objective: Vital signs in last 24 hours: Temp:  [98.2 F (36.8 C)-98.8 F (37.1 C)] 98.2 F (36.8 C) (01/16 2212) Pulse Rate:  [57-67] 58 (01/16 2212) Resp:  [15-18] 18 (01/16 2212) BP: (161-185)/(72-76) 179/76 (01/16 2212) SpO2:  [98 %-99 %] 99 % (01/16 2212)  Intake/Output from previous day:  Intake/Output Summary (Last 24 hours) at 03/29/2023 0700 Last data filed at 03/28/2023 1145 Gross per 24 hour  Intake 300 ml  Output 500 ml  Net -200 ml    Intake/Output this shift: No intake/output data recorded.  Labs: Recent Labs    03/27/23 0353  HGB 11.6*   Recent Labs    03/27/23 0353  WBC 9.2  RBC 3.89*  HCT 34.7*  PLT 265   No results for input(s): "NA", "K", "CL", "CO2", "BUN", "CREATININE", "GLUCOSE", "CALCIUM" in the last 72 hours.  No results for input(s): "LABPT", "INR" in the last 72 hours.   EXAM General - Patient is Alert and Appropriate Extremity - Bulky dressing noted to the left arm. Dressing removed, incision healing well, no erythema or purulence noted. No drainage can be expressed. Swelling has significantly improved to the left arm. Increased flexion and extension in his fingers.  Wrinkles are beginning to appear to the hand. Intact to light touch. Honeycomb dressing with new ACE applied this AM.  Past Medical History:  Diagnosis Date   ADHD (attention deficit hyperactivity disorder)    Depression    Headache(784.0)    Intellectual disability    PTSD (post-traumatic stress disorder)     Assessment/Plan: 3 Days Post-Op Procedure(s)  (LRB): TRANSESOPHAGEAL ECHOCARDIOGRAM (TEE) (N/A) Principal Problem:   Cellulitis of left forearm Active Problems:   Polysubstance abuse (HCC)   Opiate abuse, continuous (HCC)   Staphylococcus aureus bacteremia   Soft tissue infection  Estimated body mass index is 36.94 kg/m as calculated from the following:   Height as of this encounter: 6\' 1"  (1.854 m).   Weight as of this encounter: 127 kg. Advance diet Up with therapy D/C IV fluids when tolerating po intake.  Labs and vitals reviewed, WBC trending down to 9.2 on 03/27/23.Marland Kitchen  No recent fevers. Incision is healing well, no erythema or purulence noted.   Continue IV Abx while admitted per ID.  Plan is for IV Abx until 03/30/23 and then transition to oral Abx. Honeycomb dressing applied this AM.  Incision is healing well without signs of infection at this time.  Pain is controlled. Ortho will sign off at this time. Follow-up with Horris Latino, PA-C next week for a skin check following discharge.  DVT Prophylaxis - Lovenox and TED hose WBAT to the left arm.  Valeria Batman, PA-C [facility] Orthopaedic Surgery 03/29/2023, 7:00 AM

## 2023-03-29 NOTE — Progress Notes (Signed)
Date of Admission:  03/22/2023     ID: Ryan Chen is a 23 y.o. male Principal Problem:   Cellulitis of left forearm Active Problems:   Polysubstance abuse (HCC)   Opiate abuse, continuous (HCC)   Staphylococcus aureus bacteremia   Soft tissue infection    Subjective: Pt doing much better  Medications:   ARIPiprazole  15 mg Oral Daily   busPIRone  15 mg Oral TID   docusate sodium  100 mg Oral BID   enoxaparin (LOVENOX) injection  40 mg Subcutaneous Q24H   escitalopram  10 mg Oral Daily   loratadine  10 mg Oral Daily   [START ON 03/30/2023] losartan  100 mg Oral Daily   mupirocin cream   Topical BID   traZODone  150 mg Oral QHS    Objective: Vital signs in last 24 hours: Patient Vitals for the past 24 hrs:  BP Temp Pulse Resp SpO2  03/29/23 0827 (!) 165/78 98.6 F (37 C) 69 16 98 %  03/28/23 2212 (!) 179/76 98.2 F (36.8 C) (!) 58 18 99 %  03/28/23 1546 (!) 161/75 98.5 F (36.9 C) 67 16 99 %     PHYSICAL EXAM:  General: Alert, cooperative, no distress, appears stated age.  Lungs: Clear to auscultation bilaterally. No Wheezing or Rhonchi. No rales. Heart: Regular rate and rhythm, no murmur, rub or gallop. Abdomen: Soft, non-tender,not distended. Bowel sounds normal. No masses Extremities: left forearm- surgical site clean- no erythema or swelling or tenderness of surrounding tissue   On admission    Lymph: Cervical, supraclavicular normal. Neurologic: Grossly non-focal  Lab Results    Latest Ref Rng & Units 03/27/2023    3:53 AM 03/26/2023    5:35 AM 03/23/2023    2:31 PM  CBC  WBC 4.0 - 10.5 K/uL 9.2  10.3  9.8   Hemoglobin 13.0 - 17.0 g/dL 14.7  82.9  56.2   Hematocrit 39.0 - 52.0 % 34.7  36.2  38.2   Platelets 150 - 400 K/uL 265  291  275        Latest Ref Rng & Units 03/26/2023    5:35 AM 03/24/2023    4:46 AM 03/22/2023    8:54 PM  CMP  Glucose 70 - 99 mg/dL 130  865  93   BUN 6 - 20 mg/dL 16  25  18    Creatinine 0.61 - 1.24 mg/dL 7.84   6.96  2.95   Sodium 135 - 145 mmol/L 138  136  139   Potassium 3.5 - 5.1 mmol/L 4.2  4.4  3.7   Chloride 98 - 111 mmol/L 105  106  106   CO2 22 - 32 mmol/L 27  23  23    Calcium 8.9 - 10.3 mg/dL 8.9  9.1  9.3   Total Protein 6.5 - 8.1 g/dL  6.1  6.9   Total Bilirubin 0.0 - 1.2 mg/dL  0.4  0.3   Alkaline Phos 38 - 126 U/L  55  69   AST 15 - 41 U/L  19  20   ALT 0 - 44 U/L  17  24       Microbiology: [facility] FROM 03/22/2023 MSSA Blood culture from 03/24/2023 so far no growth Wound culture from 03/23/2023 MSSA   Assessment/Plan: Staph aureus bacteremia  patient is  on cefazolin repeat blood culture sent on 03/24/2023  negative TEE negative for endocarditis Continue cefazolin for a minimum of 1 week IV until 03/30/2023 and  then can be switched to oral antibiotics  cefadroxil 1 g p.o. ( prescribe 500mg  X 2) every 12 for another 2 weeks. Please make sure patient has antibiotic in hand before discharge    Left forearm abscess and subcutaneous infection secondary to Staph aureus from mainlining of fentanyl and crystal meth and missing the mark Status post I&D  Right calf wound For topical mupirocin   HIV negative Hep C negative Hepatitis B surface antigen nonreactive     Intellectual disability  Depression, PTSD   Polysubstance use  Discussed the management with the patient.and Dr.Poggi  ID will not see him this weekend- on call physician available by phone if needed for urgent issues

## 2023-03-30 DIAGNOSIS — L03114 Cellulitis of left upper limb: Secondary | ICD-10-CM | POA: Diagnosis not present

## 2023-03-30 MED ORDER — CEFADROXIL 500 MG PO CAPS
1000.0000 mg | ORAL_CAPSULE | Freq: Two times a day (BID) | ORAL | Status: DC
  Administered 2023-03-30: 1000 mg via ORAL
  Filled 2023-03-30: qty 2

## 2023-03-30 MED ORDER — METHADONE HCL 10 MG/ML PO CONC: 80.0000 mg | Freq: Every day | ORAL | 0 refills | Status: DC

## 2023-03-30 MED ORDER — CEFADROXIL 500 MG PO CAPS: 1000.0000 mg | ORAL_CAPSULE | Freq: Two times a day (BID) | ORAL | 0 refills | Status: AC

## 2023-03-30 MED ORDER — ESCITALOPRAM OXALATE 10 MG PO TABS: 10.0000 mg | ORAL_TABLET | Freq: Every day | ORAL | 2 refills | Status: AC

## 2023-03-30 MED ORDER — LOSARTAN POTASSIUM 100 MG PO TABS: 100.0000 mg | ORAL_TABLET | Freq: Every day | ORAL | 2 refills | Status: AC

## 2023-03-30 MED ORDER — METHADONE HCL 10 MG PO TABS
80.0000 mg | ORAL_TABLET | Freq: Every day | ORAL | Status: DC
  Administered 2023-03-30: 80 mg via ORAL
  Filled 2023-03-30: qty 8

## 2023-03-30 MED ORDER — ARIPIPRAZOLE 15 MG PO TABS: 15.0000 mg | ORAL_TABLET | Freq: Every day | ORAL | 2 refills | Status: AC

## 2023-03-30 MED ORDER — TRAZODONE HCL 150 MG PO TABS: 150.0000 mg | ORAL_TABLET | Freq: Every day | ORAL | 2 refills | Status: AC

## 2023-03-30 MED ORDER — BUSPIRONE HCL 15 MG PO TABS: 15.0000 mg | ORAL_TABLET | Freq: Three times a day (TID) | ORAL | 2 refills | Status: AC

## 2023-03-30 NOTE — Progress Notes (Signed)
1610 80mg  PO of methadone given to pt. Verified and witnessed with Raynelle Fanning RN  [phone] D/C AVS completed and reviewed with pt. All opportunities for questions answered and clarified. IV removed. Pt will be wheeled down to car at medical mall entrance via wheelchair.

## 2023-03-30 NOTE — Discharge Summary (Incomplete)
Physician Discharge Summary   Ryan Chen  male DOB: February 02, 2001  ZOX:[id number]  PCP: Jerl Mina, MD  Admit date: 03/22/2023 Discharge date: 03/30/2023  Admitted From: home Disposition:  home Updated mother on the phone prior to discharge, who will pick up pt's medications. CODE STATUS: Full code  Discharge Instructions     Discharge instructions   Complete by: As directed    Please finish 2 more weeks of oral antibiotic Duricef as directed for your arm infection.  Your blood pressure has been high, so you have been started on Losartan 100 mg daily.  Please follow up with outpatient provider to further adjust your blood pressure medication. - -   Discharge wound care:   Complete by: As directed    Follow-up with Horris Latino, PA-C next week for a skin check following discharge. sutures should be removed on 04/08/23 [facility] Course:  For full details, please see H&P, progress notes, consult notes and ancillary notes.  Briefly,  Ryan Chen is a 23 y.o. Caucasian male with medical history significant for ADHD, depression, PTSD and polysubstance abuse including methamphetamine and fentanyl, who presented to the emergency room with worsening left forearm erythema with induration, swelling and tenderness with pain.  Injected IV fentanyl to the arm 3 days ago.  CT scan showed soft tissue edema with trace forearm emphysema but no organized fluid collection.  Due to not able to rule out necrotizing fasciitis, orthopedics consult was obtained.  Patient is treated with meropenem and vancomycin. Patient had I&D performed on 1/11, did not show evidence of necrotizing fasciitis.  Blood culture finalized with MSSA, as was wound culture. antibiotic switched to cefazolin on 1/11. Repeated wound irrigation 1/13.    * Cellulitis of left forearm with abscess secondary to Staph aureus. Staph aureus bacteremia. Patient is status post I&D, no evidence of necrotizing  fasciitis.  Blood culture in bottle come back with Staph aureus, initially on meropenem and vancomycin.  Changed to cefazolin, both wound culture and blood culture came back with MSSA. Consult from ID obtained, TTE did not show any vegetation with ejection fraction 50 to 55%. TEE neg.  Patient was brought to the OR again on 1/13, wound washed, no further debridement needed. --received cefazolin for a minimum of 1 week IV until 03/30/2023 and then switched to cefadroxil 1 g p.o. every 12 for another 2 weeks    Polysubstance abuse (HCC) - IV fentanyl and methamphetamine as well as vaping. --plan to enter rehab on 04/02/23   Anxiety and depression --cont Abilify, Buspar, Lexapro and trazodone   Opiate abuse, continuous (HCC) Discussed with patient, he is not currently taking methadone, he will check himself into rehab once discharged from hospital. --attempted to Rx methadone for 7 days to help pt's opioid withdrawal until he enters drug rehab facility, however, later was notified from pharm that methadone for withdrawal can only be prescribed by methadone clinic.  Pt's mother requested a short Rx of Ambien and Requip to help with pt's withdrawal symptoms; Rx for 5 days provided.   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   Elevated BP --no hx of HTN, not on antihypertensives PTA --started on losartan to 100 mg daily   Discharge Diagnoses:  Principal Problem:   Cellulitis of left forearm Active Problems:   Polysubstance abuse (HCC)   Opiate abuse, continuous (HCC)   Staphylococcus aureus bacteremia   Soft tissue infection   30 Day Unplanned Readmission Risk  Score    Flowsheet Row ED to Hosp-Admission (Current) from 03/22/2023 in [facility] (1A)  30 Day Unplanned Readmission Risk Score (%) 20.49 Filed at 03/30/2023 0800       This score is the patient's risk of an unplanned readmission within 30 days of being discharged (0 -100%). The score is based on  dignosis, age, lab data, medications, orders, and past utilization.   Low:  0-14.9   Medium: 15-21.9   High: 22-29.9   Extreme: 30 and above         Discharge Instructions:  Allergies as of 03/30/2023       Reactions   Amoxicillin Nausea And Vomiting   Ineffective .Marland KitchenHas patient had a PCN reaction causing immediate rash, facial/tongue/throat swelling, SOB or lightheadedness with hypotension: No Has patient had a PCN reaction causing severe rash involving mucus membranes or skin necrosis: No Has patient had a PCN reaction that required hospitalization No Has patient had a PCN reaction occurring within the last 10 years: Yes If all of the above answers are "NO", then may proceed with Cephalosporin use.        Medication List     TAKE these medications    ARIPiprazole 15 MG tablet Commonly known as: Abilify Take 1 tablet (15 mg total) by mouth daily. What changed: Another medication with the same name was removed. Continue taking this medication, and follow the directions you see here.   busPIRone 15 MG tablet Commonly known as: BUSPAR Take 1 tablet (15 mg total) by mouth 3 (three) times daily.   cefadroxil 500 MG capsule Commonly known as: DURICEF Take 2 capsules (1,000 mg total) by mouth 2 (two) times daily for 14 days.   cetirizine 10 MG tablet Commonly known as: ZYRTEC Take 10 mg by mouth daily.   escitalopram 10 MG tablet Commonly known as: Lexapro Take 1 tablet (10 mg total) by mouth daily.   losartan 100 MG tablet Commonly known as: COZAAR Take 1 tablet (100 mg total) by mouth daily.   methadone 10 MG/ML solution Commonly known as: DOLOPHINE Take 8 mLs (80 mg total) by mouth daily for 7 days. What changed: how much to take   naloxone 4 MG/0.1ML Liqd nasal spray kit Commonly known as: NARCAN sign   ondansetron 4 MG disintegrating tablet Commonly known as: ZOFRAN-ODT Take 1 tablet (4 mg total) by mouth every 6 (six) hours as needed for nausea or  vomiting.   traZODone 150 MG tablet Commonly known as: DESYREL Take 1 tablet (150 mg total) by mouth at bedtime.               Discharge Care Instructions  (From admission, onward)           Start     Ordered   03/30/23 0000  Discharge wound care:       Comments: Follow-up with Horris Latino, PA-C next week for a skin check following discharge. sutures should be removed on 04/08/23 - -   03/30/23 0810             Follow-up Information     Marlan, Lenney, PA-C Follow up in 5 day(s).   Specialty: Physician Assistant Why: Skin check next week in the office. Contact information: [address] [place] [address] [phone]         Jerl Mina, MD Follow up.   Specialty: Family Medicine Contact information: [address] [place] [address] [phone]  Outpatient drug rehab facility. Go on 04/02/2023.                  Allergies  Allergen Reactions   Amoxicillin Nausea And Vomiting    Ineffective .Marland KitchenHas patient had a PCN reaction causing immediate rash, facial/tongue/throat swelling, SOB or lightheadedness with hypotension: No Has patient had a PCN reaction causing severe rash involving mucus membranes or skin necrosis: No Has patient had a PCN reaction that required hospitalization No Has patient had a PCN reaction occurring within the last 10 years: Yes If all of the above answers are "NO", then may proceed with Cephalosporin use.      The results of significant diagnostics from this hospitalization (including imaging, microbiology, ancillary and laboratory) are listed below for reference.   Consultations:   Procedures/Studies: ECHO TEE Result Date: 03/26/2023    TRANSESOPHOGEAL ECHO REPORT   Patient Name:   WWILLIAM BISCARDI [facility] Date of Exam: 03/26/2023 Medical Rec #:  [id number]          Height:       73.0 in Accession #:    [id number]         Weight:       280.0 lb Date of Birth:  07/13/2000           BSA:           2.482 m Patient Age:    22 years           BP:           138/74 mmHg Patient Gender: M                  HR:           67 bpm. Exam Location:  [facility] Procedure: Transesophageal Echo, Cardiac Doppler, Color Doppler and 3D Echo Indications:     Endocarditis  History:         Patient has prior history of Echocardiogram examinations, most                  recent 03/25/2023. Headache, PTSD.  Sonographer:     Cristela Blue Referring Phys:  [id number] CARALYN HUDSON Diagnosing Phys: Windell Norfolk PROCEDURE: After discussion of the risks and benefits of a TEE, an informed consent was obtained from the patient. The transesophogeal probe was passed without difficulty through the esophogus of the patient. Imaged were obtained with the patient in a supine position. Sedation performed by different physician. The patient was monitored while under deep sedation. Image quality was excellent. The patient's vital signs; including heart rate, blood pressure, and oxygen saturation; remained stable throughout the procedure. The patient developed no complications during the procedure.  IMPRESSIONS  1. Left ventricular ejection fraction, by estimation, is 50 to 55%. The left ventricle has normal function.  2. Right ventricular systolic function is normal. The right ventricular size is normal.  3. No left atrial/left atrial appendage thrombus was detected.  4. The mitral valve is normal in structure. Trivial mitral valve regurgitation.  5. The aortic valve is normal in structure. Aortic valve regurgitation is not visualized. Conclusion(s)/Recommendation(s): No evidence of vegetation/infective endocarditis on this transesophageael echocardiogram. FINDINGS  Left Ventricle: Left ventricular ejection fraction, by estimation, is 50 to 55%. The left ventricle has normal function. The left ventricular internal cavity size was normal in size. Right Ventricle: The right ventricular size is normal. No increase in right ventricular wall thickness. Right  ventricular systolic function is normal. Left Atrium: Left atrial size was  normal in size. No left atrial/left atrial appendage thrombus was detected. Right Atrium: Right atrial size was normal in size. Pericardium: There is no evidence of pericardial effusion. Mitral Valve: The mitral valve is normal in structure. Trivial mitral valve regurgitation. Tricuspid Valve: The tricuspid valve is normal in structure. Tricuspid valve regurgitation is trivial. Aortic Valve: The aortic valve is normal in structure. Aortic valve regurgitation is not visualized. Pulmonic Valve: The pulmonic valve was normal in structure. Pulmonic valve regurgitation is not visualized. Aorta: The aortic root is normal in size and structure. IAS/Shunts: No atrial level shunt detected by color flow Doppler. Windell Norfolk Electronically signed by Windell Norfolk Signature Date/Time: 03/26/2023/5:18:38 PM    Final    ECHOCARDIOGRAM COMPLETE BUBBLE STUDY Result Date: 03/25/2023    ECHOCARDIOGRAM REPORT   Patient Name:   JORDA CUNG [facility] Date of Exam: 03/25/2023 Medical Rec #:  [id number]          Height:       73.0 in Accession #:    [id number]         Weight:       280.0 lb Date of Birth:  2000/06/25           BSA:          2.482 m Patient Age:    22 years           BP:           158/80 mmHg Patient Gender: M                  HR:           65 bpm. Exam Location:  [facility] Procedure: 2D Echo, Cardiac Doppler and Color Doppler Indications:     Endocarditis  History:         Patient has no prior history of Echocardiogram examinations.                  Endocarditis, Polysubstance abuse.  Sonographer:     Mikki Harbor Referring Phys:  [id number] [facility] Diagnosing Phys: Windell Norfolk  Sonographer Comments: Technically difficult study due to poor echo windows and patient is obese. No authorization for the use of Definity. IMPRESSIONS  1. Left ventricular ejection fraction, by estimation, is 50 to 55%. The left ventricle has low normal function. The  left ventricle has no regional wall motion abnormalities. Left ventricular diastolic parameters were normal.  2. Right ventricular systolic function is normal. The right ventricular size is normal. There is normal pulmonary artery systolic pressure.  3. The mitral valve is normal in structure. Trivial mitral valve regurgitation.  4. The aortic valve is tricuspid. Aortic valve regurgitation is not visualized. Conclusion(s)/Recommendation(s): No obvious valvular vegetation noted. If high clinical suspicion for endocarditis, consider TEE for further evaluation. FINDINGS  Left Ventricle: Left ventricular ejection fraction, by estimation, is 50 to 55%. The left ventricle has low normal function. The left ventricle has no regional wall motion abnormalities. The left ventricular internal cavity size was normal in size. There is no left ventricular hypertrophy. Left ventricular diastolic parameters were normal. Right Ventricle: The right ventricular size is normal. No increase in right ventricular wall thickness. Right ventricular systolic function is normal. There is normal pulmonary artery systolic pressure. The tricuspid regurgitant velocity is 2.68 m/s, and  with an assumed right atrial pressure of 3 mmHg, the estimated right ventricular systolic pressure is 31.7 mmHg. Left Atrium: Left atrial size was normal in size. Right Atrium: Right atrial  size was normal in size. Pericardium: There is no evidence of pericardial effusion. Mitral Valve: The mitral valve is normal in structure. Trivial mitral valve regurgitation. MV peak gradient, 6.7 mmHg. The mean mitral valve gradient is 2.0 mmHg. Tricuspid Valve: The tricuspid valve is normal in structure. Tricuspid valve regurgitation is mild. Aortic Valve: The aortic valve is tricuspid. Aortic valve regurgitation is not visualized. Aortic valve mean gradient measures 3.0 mmHg. Aortic valve peak gradient measures 6.1 mmHg. Pulmonic Valve: The pulmonic valve was not well  visualized. Pulmonic valve regurgitation is not visualized. Aorta: The aortic root is normal in size and structure. Venous: The inferior vena cava was not well visualized. IAS/Shunts: The interatrial septum was not well visualized.   Diastology LV e' medial:    13.20 cm/s LV E/e' medial:  8.7 LV e' lateral:   16.10 cm/s LV E/e' lateral: 7.1  RIGHT VENTRICLE RV Basal diam:  4.20 cm RV Mid diam:    3.80 cm TAPSE (M-mode): 2.5 cm LEFT ATRIUM             Index        RIGHT ATRIUM           Index LA Vol (A2C):   80.0 ml 32.23 ml/m  RA Area:     12.30 cm LA Vol (A4C):   54.8 ml 22.08 ml/m  RA Volume:   29.70 ml  11.96 ml/m LA Biplane Vol: 69.7 ml 28.08 ml/m  AORTIC VALVE                   PULMONIC VALVE AV Vmax:           123.00 cm/s PV Vmax:       0.94 m/s AV Vmean:          76.600 cm/s PV Peak grad:  3.5 mmHg AV VTI:            0.245 m AV Peak Grad:      6.1 mmHg AV Mean Grad:      3.0 mmHg LVOT Vmax:         102.00 cm/s LVOT Vmean:        49.900 cm/s LVOT VTI:          0.196 m LVOT/AV VTI ratio: 0.80 MITRAL VALVE                TRICUSPID VALVE MV Area (PHT): 3.01 cm     TR Peak grad:   28.7 mmHg MV Peak grad:  6.7 mmHg     TR Vmax:        268.00 cm/s MV Mean grad:  2.0 mmHg MV Vmax:       1.29 m/s     SHUNTS MV Vmean:      59.8 cm/s    Systemic VTI: 0.20 m MV Decel Time: 252 msec MV E velocity: 115.00 cm/s MV A velocity: 47.70 cm/s MV E/A ratio:  2.41 Mellody Drown Alluri Electronically signed by Windell Norfolk Signature Date/Time: 03/25/2023/11:07:33 AM    Final    [place] OR NERVE BLOCK-IMAGE ONLY [facility]) Result Date: 03/23/2023 There is no interpretation for this exam.  This order is for images obtained during a surgical procedure.  Please See "Surgeries" Tab for more information regarding the procedure.   [place] OR NERVE BLOCK-IMAGE ONLY [facility]) Result Date: 03/23/2023 There is no interpretation for this exam.  This order is for images obtained during a surgical procedure.  Please See "Surgeries" Tab for more information  regarding the  procedure.   CT FOREARM LEFT W CONTRAST Result Date: 03/22/2023 CLINICAL DATA:  concern for nec fasc left forearm from IV drug use ongoing for 3 days. Patient seen at [facility] today for same and dx with necrotizing fasciti EXAM: CT OF THE UPPER LEFT EXTREMITY WITH CONTRAST TECHNIQUE: Multidetector CT imaging of the upper left extremity was performed according to the standard protocol following intravenous contrast administration. RADIATION DOSE REDUCTION: This exam was performed according to the departmental dose-optimization program which includes automated exposure control, adjustment of the mA and/or kV according to patient size and/or use of iterative reconstruction technique. CONTRAST:  OMNIPAQUE IOHEXOL 300 MG/ML  SOLN COMPARISON:  None Available. FINDINGS: Bones/Joint/Cartilage No evidence of fracture, dislocation, or joint effusion. No evidence of severe arthropathy. No cortical erosion or destruction. No aggressive appearing focal bone abnormality. Ligaments Suboptimally assessed by CT. Muscles and Tendons Grossly unremarkable. Soft tissues Diffuse subcutaneus soft tissue edema of the visualized elbow and forearm most prominent along the dorsal aspect. Few foci of subcutaneus soft tissue gas along the medial distal forearm soft tissues (8:33). Overlying dermal thickening. No retained radiopaque foreign body. Vascular: Unremarkable. IMPRESSION: 1. Subcutaneus soft tissue edema of the elbow and forearm with associated trace distal forearm emphysema. Overlying dermal thickening. No organized fluid collection. No retained radiopaque foreign body. Findings could represent cellulitis with necrotizing fasciitis is not excluded-please note this is a clinical diagnosis. 2.  Negative for acute osseous abnormality. Electronically Signed   By: Tish Frederickson M.D.   On: 03/22/2023 22:20      Labs: BNP (last 3 results) No results for input(s): "BNP" in the last 8760 hours. Basic Metabolic  Panel: Recent Labs  Lab 03/24/23 0446 03/26/23 0535  NA 136 138  K 4.4 4.2  CL 106 105  CO2 23 27  GLUCOSE 137* 103*  BUN 25* 16  CREATININE 0.82 0.72  CALCIUM 9.1 8.9  MG 2.1  --    Liver Function Tests: Recent Labs  Lab 03/24/23 0446  AST 19  ALT 17  ALKPHOS 55  BILITOT 0.4  PROT 6.1*  ALBUMIN 3.1*   No results for input(s): "LIPASE", "AMYLASE" in the last 168 hours. No results for input(s): "AMMONIA" in the last 168 hours. CBC: Recent Labs  Lab 03/23/23 1431 03/26/23 0535 03/27/23 0353  WBC 9.8 10.3 9.2  HGB 12.7* 12.3* 11.6*  HCT 38.2* 36.2* 34.7*  MCV 90.5 88.3 89.2  PLT 275 291 265   Cardiac Enzymes: No results for input(s): "CKTOTAL", "CKMB", "CKMBINDEX", "TROPONINI" in the last 168 hours. BNP: Invalid input(s): "POCBNP" CBG: Recent Labs  Lab 03/24/23 1741  GLUCAP 170*   D-Dimer No results for input(s): "DDIMER" in the last 72 hours. Hgb A1c No results for input(s): "HGBA1C" in the last 72 hours. Lipid Profile No results for input(s): "CHOL", "HDL", "LDLCALC", "TRIG", "CHOLHDL", "LDLDIRECT" in the last 72 hours. Thyroid function studies No results for input(s): "TSH", "T4TOTAL", "T3FREE", "THYROIDAB" in the last 72 hours.  Invalid input(s): "FREET3" Anemia work up No results for input(s): "VITAMINB12", "FOLATE", "FERRITIN", "TIBC", "IRON", "RETICCTPCT" in the last 72 hours. Urinalysis    Component Value Date/Time   COLORURINE YELLOW (A) 11/18/2022 1054   APPEARANCEUR CLEAR (A) 11/18/2022 1054   LABSPEC 1.030 11/18/2022 1054   PHURINE 5.0 11/18/2022 1054   GLUCOSEU NEGATIVE 11/18/2022 1054   HGBUR NEGATIVE 11/18/2022 1054   BILIRUBINUR NEGATIVE 11/18/2022 1054   KETONESUR NEGATIVE 11/18/2022 1054   PROTEINUR NEGATIVE 11/18/2022 1054   NITRITE NEGATIVE 11/18/2022 1054  LEUKOCYTESUR NEGATIVE 11/18/2022 1054   Sepsis Labs Recent Labs  Lab 03/23/23 1431 03/26/23 0535 03/27/23 0353  WBC 9.8 10.3 9.2   Microbiology Recent Results  (from the past 240 hours)  Blood culture (routine x 2)     Status: Abnormal   Collection Time: 03/22/23  8:54 PM   Specimen: BLOOD  Result Value Ref Range Status   Specimen Description   Final    BLOOD RIGHT ANTECUBITAL Performed at [facility], [address], [place], [place] [address]    Special Requests   Final    BOTTLES DRAWN AEROBIC AND ANAEROBIC Blood Culture adequate volume Performed at [facility], [address] [place], [place] [address]    Culture  Setup Time   Final    GRAM POSITIVE COCCI ANAEROBIC BOTTLE ONLY CRITICAL RESULT CALLED TO, READ BACK BY AND VERIFIED WITH: MADISON HUNT @1936  03/23/23 LFD Performed at [facility], [address] [place], [place] [address]    Culture STAPHYLOCOCCUS AUREUS (A)  Final   Report Status 03/25/2023 FINAL  Final   Organism ID, Bacteria STAPHYLOCOCCUS AUREUS  Final      Susceptibility   Staphylococcus aureus - MIC*    CIPROFLOXACIN <=0.5 SENSITIVE Sensitive     ERYTHROMYCIN <=0.25 SENSITIVE Sensitive     GENTAMICIN <=0.5 SENSITIVE Sensitive     OXACILLIN <=0.25 SENSITIVE Sensitive     TETRACYCLINE <=1 SENSITIVE Sensitive     VANCOMYCIN 1 SENSITIVE Sensitive     TRIMETH/SULFA <=10 SENSITIVE Sensitive     CLINDAMYCIN <=0.25 SENSITIVE Sensitive     RIFAMPIN <=0.5 SENSITIVE Sensitive     Inducible Clindamycin NEGATIVE Sensitive     LINEZOLID 2 SENSITIVE Sensitive     * STAPHYLOCOCCUS AUREUS  Blood Culture ID Panel (Reflexed)     Status: Abnormal   Collection Time: 03/22/23  8:54 PM  Result Value Ref Range Status   Enterococcus faecalis NOT DETECTED NOT DETECTED Final   Enterococcus Faecium NOT DETECTED NOT DETECTED Final   Listeria monocytogenes NOT DETECTED NOT DETECTED Final   Staphylococcus species DETECTED (A) NOT DETECTED Final    Comment: CRITICAL RESULT CALLED TO, READ BACK BY AND VERIFIED WITH: Madison Hunt @1936  03/23/23 lfd    Staphylococcus aureus (BCID) DETECTED (A) NOT DETECTED Final     Comment: CRITICAL RESULT CALLED TO, READ BACK BY AND VERIFIED WITH: Madison Hunt @1936  03/23/2023 lfd    Staphylococcus epidermidis NOT DETECTED NOT DETECTED Final   Staphylococcus lugdunensis NOT DETECTED NOT DETECTED Final   Streptococcus species NOT DETECTED NOT DETECTED Final   Streptococcus agalactiae NOT DETECTED NOT DETECTED Final   Streptococcus pneumoniae NOT DETECTED NOT DETECTED Final   Streptococcus pyogenes NOT DETECTED NOT DETECTED Final   A.calcoaceticus-baumannii NOT DETECTED NOT DETECTED Final   Bacteroides fragilis NOT DETECTED NOT DETECTED Final   Enterobacterales NOT DETECTED NOT DETECTED Final   Enterobacter cloacae complex NOT DETECTED NOT DETECTED Final   Escherichia coli NOT DETECTED NOT DETECTED Final   Klebsiella aerogenes NOT DETECTED NOT DETECTED Final   Klebsiella oxytoca NOT DETECTED NOT DETECTED Final   Klebsiella pneumoniae NOT DETECTED NOT DETECTED Final   Proteus species NOT DETECTED NOT DETECTED Final   Salmonella species NOT DETECTED NOT DETECTED Final   Serratia marcescens NOT DETECTED NOT DETECTED Final   Haemophilus influenzae NOT DETECTED NOT DETECTED Final   Neisseria meningitidis NOT DETECTED NOT DETECTED Final   Pseudomonas aeruginosa NOT DETECTED NOT DETECTED Final   Stenotrophomonas maltophilia NOT DETECTED NOT  DETECTED Final   Candida albicans NOT DETECTED NOT DETECTED Final   Candida auris NOT DETECTED NOT DETECTED Final   Candida glabrata NOT DETECTED NOT DETECTED Final   Candida krusei NOT DETECTED NOT DETECTED Final   Candida parapsilosis NOT DETECTED NOT DETECTED Final   Candida tropicalis NOT DETECTED NOT DETECTED Final   Cryptococcus neoformans/gattii NOT DETECTED NOT DETECTED Final   Meth resistant mecA/C and MREJ NOT DETECTED NOT DETECTED Final    Comment: Performed at [facility], [address] [place], [place] [address]  Blood culture (routine x 2)     Status: None   Collection Time: 03/22/23  9:44 PM   Specimen:  BLOOD  Result Value Ref Range Status   Specimen Description BLOOD RIGHT ARM  Final   Special Requests   Final    BOTTLES DRAWN AEROBIC AND ANAEROBIC Blood Culture results may not be optimal due to an inadequate volume of blood received in culture bottles   Culture   Final    NO GROWTH 5 DAYS Performed at [facility], [address], [place], [place] [address]    Report Status 03/27/2023 FINAL  Final  Aerobic/Anaerobic Culture w Gram Stain (surgical/deep wound)     Status: None   Collection Time: 03/23/23 11:38 AM   Specimen: Wound; Abscess  Result Value Ref Range Status   Specimen Description   Final    WOUND Performed at [facility]. [facility], [address] [place], [place] [address]    Special Requests LEFT FOREARM ABSCESS  Final   Gram Stain RARE WBC SEEN RARE GRAM POSITIVE COCCI   Final   Culture   Final    MODERATE STAPHYLOCOCCUS AUREUS NO ANAEROBES ISOLATED Performed at [facility], [address] [place], [place] [address]    Report Status 03/28/2023 FINAL  Final   Organism ID, Bacteria STAPHYLOCOCCUS AUREUS  Final      Susceptibility   Staphylococcus aureus - MIC*    CIPROFLOXACIN <=0.5 SENSITIVE Sensitive     ERYTHROMYCIN <=0.25 SENSITIVE Sensitive     GENTAMICIN <=0.5 SENSITIVE Sensitive     OXACILLIN <=0.25 SENSITIVE Sensitive     TETRACYCLINE <=1 SENSITIVE Sensitive     VANCOMYCIN 1 SENSITIVE Sensitive     TRIMETH/SULFA <=10 SENSITIVE Sensitive     CLINDAMYCIN <=0.25 SENSITIVE Sensitive     RIFAMPIN <=0.5 SENSITIVE Sensitive     Inducible Clindamycin NEGATIVE Sensitive     LINEZOLID 2 SENSITIVE Sensitive     * MODERATE STAPHYLOCOCCUS AUREUS  Culture, blood (Routine X 2) w Reflex to ID Panel     Status: None   Collection Time: 03/24/23  3:28 PM   Specimen: BLOOD  Result Value Ref Range Status   Specimen Description BLOOD BLOOD RIGHT ARM  Final   Special Requests   Final    BOTTLES DRAWN AEROBIC AND ANAEROBIC Blood Culture adequate  volume   Culture   Final    NO GROWTH 5 DAYS Performed at [facility], [address], [place], [place] [address]    Report Status 03/29/2023 FINAL  Final  Culture, blood (Routine X 2) w Reflex to ID Panel     Status: None   Collection Time: 03/24/23  3:38 PM   Specimen: BLOOD  Result Value Ref Range Status   Specimen Description BLOOD BLOOD RIGHT HAND  Final   Special Requests   Final    BOTTLES DRAWN AEROBIC AND ANAEROBIC Blood Culture results may not be optimal due to  an excessive volume of blood received in culture bottles   Culture   Final    NO GROWTH 5 DAYS Performed at [facility], [address] [place], [place] [address]    Report Status 03/29/2023 FINAL  Final     Total time spend on discharging this patient, including the last patient exam, discussing the hospital stay, instructions for ongoing care as it relates to all pertinent caregivers, as well as preparing the medical discharge records, prescriptions, and/or referrals as applicable, is 50 minutes.    Darlin Priestly, MD  [facility] 03/30/2023, 8:11 AM

## 2023-03-31 ENCOUNTER — Other Ambulatory Visit: Payer: Self-pay | Admitting: Hospitalist

## 2023-03-31 MED ORDER — METHADONE HCL 10 MG PO TABS: 80.0000 mg | ORAL_TABLET | Freq: Every day | ORAL | 0 refills | Status: AC

## 2023-04-01 ENCOUNTER — Other Ambulatory Visit: Payer: Self-pay | Admitting: Hospitalist

## 2023-04-01 ENCOUNTER — Telehealth (HOSPITAL_COMMUNITY): Payer: Self-pay | Admitting: Pharmacy Technician

## 2023-04-01 MED ORDER — ZOLPIDEM TARTRATE ER 12.5 MG PO TBCR
12.5000 mg | EXTENDED_RELEASE_TABLET | Freq: Every evening | ORAL | 0 refills | Status: AC | PRN
Start: 2023-04-01 — End: 2024-03-31

## 2023-04-01 MED ORDER — ROPINIROLE HCL 2 MG PO TABS: 2.0000 mg | ORAL_TABLET | Freq: Two times a day (BID) | ORAL | 0 refills | Status: AC | PRN

## 2023-04-01 NOTE — Telephone Encounter (Signed)
Pharmacy Patient Advocate Encounter   Received notification from Fax that prior authorization for Zolpidem Tartrate ER 12.5MG  er tablets is required/requested.   Insurance verification completed.   The patient is insured through [place] .   Per test claim: PA required; PA submitted to above mentioned insurance via CoverMyMeds Key/confirmation #/EOC [id number] Status is pending

## 2023-04-01 NOTE — Telephone Encounter (Signed)
Pharmacy Patient Advocate Encounter   Received notification from Fax that prior authorization for Methadone HCl 10MG  tablets is required/requested.   Insurance verification completed.   The patient is insured through [place] .   Per test claim: PA required; PA submitted to above mentioned insurance via CoverMyMeds Key/confirmation #/EOC [id number] Status is pending

## 2023-04-03 NOTE — Telephone Encounter (Signed)
Pharmacy Patient Advocate Encounter  Received notification from [facility] that Prior Authorization for Zolpidem Tartrate ER 12.5MG  er tablets  has been DENIED.  Full denial letter will be uploaded to the media tab. See denial reason below.  Policy rules found in the [facility] Preferred Drug List (PDL) for ?Sedative Hypnotics? guided our decision. Here are the policy requirements your request did not meet: 1. Records show you have tried and failed at least two (2) preferred drugs from this drug class or group of drugs. Preferred drugs for this plan are eszopiclone tablet, flurazepam capsule, ramelteon tablet, temazepam capsule (15mg , 30mg ), zaleplon capsule, zolpidem immediate release (IR) tablet. Please note, the drugs listed here may require pre-approval and have limits on the amount covered. OR 2. Records show you have a documented allergy or contraindication to all the preferred drugs in this drug class or group of drugs. This means you have tried these drugs before and had a bad reaction such as a skin rash, itching, swelling, wheezing, or other breathing problems, or you have a medical reason why all the preferred drugs cannot be used. Since the policy requirements have not been met, we are not able to approve this request. PA #/Case ID/Reference #: [id number]

## 2023-04-03 NOTE — Telephone Encounter (Signed)
Pharmacy Patient Advocate Encounter  Received notification from [facility] that Prior Authorization for Methadone HCl 10MG  tablets  has been DENIED.  Full denial letter will be uploaded to the media tab. See denial reason below. Decision Notes: Policy rules found in the [facility] Clinical Coverage Policy for ?Opioid Analgesics? guided our decision. Here are the policy requirements your request did not meet: 1. Records show you have moderate to severe pain and need to have around-the-clock pain relief for a longer period of time. 2. Your total combined daily dose of all opioid drugs is 90mg  or less of morphine or an equivalent dose. 3. Records show your diagnosis and the reason why you need more than the limit of 90mg  of morphine or an equivalent dose per day. 4. Records show how long you will use more than the daily limit of 90mg  of morphine or an equivalent dose. Since the policy requirements have not been met, we are not able to approve this request.   PA #/Case ID/Reference #: [id number] Key: [id number]

## 2023-04-04 ENCOUNTER — Telehealth: Payer: Self-pay

## 2023-04-04 NOTE — Telephone Encounter (Signed)
Patient's mother called stating patient is now in a detox facility in [facility] and would like to know what to do about patient's follow up appointment.  The facility is unable to do let the patient do a video visit and cannot the farthest appointment they will bring him to would have to be in [place]. The facility is managing the wound, patient is taking the abx and they will be removing the stiches. Per Dr. Rivka Safer okay for the patient to not follow up here and if anything acute comes up then to give [place] a call. Patient mother informed and verbalized understanding. Angie Piercey [name], CMA

## 2023-04-11 ENCOUNTER — Inpatient Hospital Stay: Payer: MEDICAID | Admitting: Infectious Diseases

## 2023-04-17 ENCOUNTER — Emergency Department: Payer: MEDICAID

## 2023-04-17 ENCOUNTER — Other Ambulatory Visit: Payer: Self-pay

## 2023-04-17 ENCOUNTER — Emergency Department
Admission: EM | Admit: 2023-04-17 | Discharge: 2023-04-18 | Disposition: A | Discharge status: Home / Self Care | Payer: MEDICAID | Attending: Emergency Medicine | Admitting: Emergency Medicine

## 2023-04-17 DIAGNOSIS — M25461 Effusion, right knee: Secondary | ICD-10-CM | POA: Diagnosis not present

## 2023-04-17 DIAGNOSIS — Z79899 Other long term (current) drug therapy: Secondary | ICD-10-CM | POA: Diagnosis not present

## 2023-04-17 DIAGNOSIS — M25561 Pain in right knee: Secondary | ICD-10-CM | POA: Diagnosis present

## 2023-04-17 DIAGNOSIS — Z4802 Encounter for removal of sutures: Secondary | ICD-10-CM | POA: Insufficient documentation

## 2023-04-17 MED ORDER — IBUPROFEN 800 MG PO TABS
800.0000 mg | ORAL_TABLET | Freq: Once | ORAL | Status: AC
Start: 2023-04-17 — End: 2023-04-17
  Administered 2023-04-17: 800 mg via ORAL
  Filled 2023-04-17: qty 1
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
## 2023-04-18 MED ORDER — ACETAMINOPHEN 500 MG PO TABS
1000.0000 mg | ORAL_TABLET | Freq: Once | ORAL | Status: AC
  Administered 2023-04-18: 1000 mg via ORAL
  Filled 2023-04-18: qty 2

## 2023-04-18 MED ORDER — IBUPROFEN 800 MG PO TABS: 800.0000 mg | ORAL_TABLET | Freq: Once | ORAL | Status: DC

## 2023-04-18 NOTE — Discharge Instructions (Signed)
 Use Ace wrap during the day while you are awake.  You may take Tylenol  and/or Ibuprofen  as needed for pain.  Return to the ER for worsening symptoms or other concerns.
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
## 2023-12-17 ENCOUNTER — Other Ambulatory Visit: Payer: Self-pay

## 2023-12-17 ENCOUNTER — Emergency Department

## 2023-12-17 ENCOUNTER — Emergency Department
Admission: EM | Admit: 2023-12-17 | Discharge: 2023-12-17 | Disposition: A | Discharge status: Home / Self Care | Attending: Emergency Medicine | Admitting: Emergency Medicine

## 2023-12-17 DIAGNOSIS — L03114 Cellulitis of left upper limb: Secondary | ICD-10-CM | POA: Insufficient documentation

## 2023-12-17 DIAGNOSIS — M7989 Other specified soft tissue disorders: Secondary | ICD-10-CM | POA: Diagnosis present

## 2023-12-17 LAB — COMPREHENSIVE METABOLIC PANEL WITH GFR
ALT: 35 U/L (ref 0–44)
AST: 29 U/L (ref 15–41)
Albumin: 3.5 g/dL (ref 3.5–5.0)
Alkaline Phosphatase: 66 U/L (ref 38–126)
Anion gap: 10 (ref 5–15)
BUN: 14 mg/dL (ref 6–20)
CO2: 22 mmol/L (ref 22–32)
Calcium: 9 mg/dL (ref 8.9–10.3)
Chloride: 108 mmol/L (ref 98–111)
Creatinine, Ser: 0.9 mg/dL (ref 0.61–1.24)
GFR, Estimated: >60 mL/min (ref >60)
Glucose, Bld: 108 mg/dL — ABNORMAL HIGH (ref 70–99)
Potassium: 4 mmol/L (ref 3.5–5.1)
Sodium: 140 mmol/L (ref 135–145)
Total Bilirubin: 0.6 mg/dL (ref 0.0–1.2)
Total Protein: 6.3 g/dL — ABNORMAL LOW (ref 6.5–8.1)

## 2023-12-17 LAB — CBC WITH DIFFERENTIAL/PLATELET
Abs Immature Granulocytes: 0.01 K/uL (ref 0.00–0.07)
Basophils Absolute: 0 K/uL (ref 0.0–0.1)
Basophils Relative: 1 %
Eosinophils Absolute: 0.7 K/uL — ABNORMAL HIGH (ref 0.0–0.5)
Eosinophils Relative: 9 %
HCT: 36.8 % — ABNORMAL LOW (ref 39.0–52.0)
Hemoglobin: 12.1 g/dL — ABNORMAL LOW (ref 13.0–17.0)
Immature Granulocytes: 0 %
Lymphocytes Relative: 27 %
Lymphs Abs: 2 K/uL (ref 0.7–4.0)
MCH: 28.9 pg (ref 26.0–34.0)
MCHC: 32.9 g/dL (ref 30.0–36.0)
MCV: 88 fL (ref 80.0–100.0)
Monocytes Absolute: 0.5 K/uL (ref 0.1–1.0)
Monocytes Relative: 7 %
Neutro Abs: 4.2 K/uL (ref 1.7–7.7)
Neutrophils Relative %: 56 %
Platelets: 188 K/uL (ref 150–400)
RBC: 4.18 MIL/uL — ABNORMAL LOW (ref 4.22–5.81)
RDW: 14.7 % (ref 11.5–15.5)
WBC: 7.5 K/uL (ref 4.0–10.5)
nRBC: 0 % (ref 0.0–0.2)

## 2023-12-17 LAB — URINALYSIS, W/ REFLEX TO CULTURE (INFECTION SUSPECTED)
Bacteria, UA: NONE SEEN
Bilirubin Urine: NEGATIVE
Glucose, UA: NEGATIVE mg/dL
Hgb urine dipstick: NEGATIVE
Ketones, ur: NEGATIVE mg/dL
Leukocytes,Ua: NEGATIVE
Nitrite: NEGATIVE
Protein, ur: NEGATIVE mg/dL
Specific Gravity, Urine: 1.026 (ref 1.005–1.030)
Squamous Epithelial / HPF: 0 /HPF (ref 0–5)
pH: 5 (ref 5.0–8.0)

## 2023-12-17 LAB — PROTIME-INR
INR: 0.9 (ref 0.8–1.2)
Prothrombin Time: 12.9 s (ref 11.4–15.2)

## 2023-12-17 LAB — LACTIC ACID, PLASMA: Lactic Acid, Venous: 1.3 mmol/L (ref 0.5–1.9)

## 2023-12-17 MED ORDER — DOXYCYCLINE HYCLATE 100 MG PO TABS
100.0000 mg | ORAL_TABLET | Freq: Once | ORAL | Status: AC
  Administered 2023-12-17: 100 mg via ORAL
  Filled 2023-12-17: qty 1

## 2023-12-17 MED ORDER — CEPHALEXIN 500 MG PO CAPS: 500.0000 mg | ORAL_CAPSULE | Freq: Four times a day (QID) | ORAL | 0 refills | Status: AC

## 2023-12-17 MED ORDER — DOXYCYCLINE HYCLATE 100 MG PO TABS: 100.0000 mg | ORAL_TABLET | Freq: Two times a day (BID) | ORAL | 0 refills | Status: AC

## 2023-12-17 MED ORDER — CEPHALEXIN 500 MG PO CAPS
500.0000 mg | ORAL_CAPSULE | Freq: Once | ORAL | Status: AC
  Administered 2023-12-17: 500 mg via ORAL
  Filled 2023-12-17: qty 1
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
## 2023-12-17 NOTE — ED Provider Notes (Signed)
 [facility] Emergency Department Provider Note     Event Date/Time   First MD Initiated Contact with Patient 12/17/23 1522     (approximate)   History   Joint Swelling   HPI  Ryan Chen is a 23 y.o. male with a history of substance use disorder, ADHD, PTSD, depression, and intellectual disability who presents himself to the ED for evaluation of swelling to his left arm.  Patient had surgery on the left upper extremity at the forearm about 10 months ago after he developed a large abscess.  Patient is also recently discharged to inpatient drug rehab and minimal intent.  He notes that he manipulated a small bump on the outside of his elbow, before he began to develop some expanding redness and warmth to the lateral arm at the elbow.  He denies any frank fevers, chills, sweats.  Patient denies any purulent drainage from the joint.  He notes tenderness to the skin overlying the joint and notes the area is swollen.  He presents to the ED for evaluation of his concern for an abscess.  Physical Exam   Triage Vital Signs: ED Triage Vitals  Encounter Vitals Group     BP 12/17/23 1431 136/72     Girls Systolic BP Percentile --      Girls Diastolic BP Percentile --      Boys Systolic BP Percentile --      Boys Diastolic BP Percentile --      Pulse Rate 12/17/23 1433 (!) 118     Resp 12/17/23 1433 19     Temp 12/17/23 1430 99.6 F (37.6 C)     Temp Source 12/17/23 1430 Oral     SpO2 12/17/23 1433 95 %     Weight 12/17/23 1431 275 lb (124.7 kg)     Height 12/17/23 1431 6' (1.829 m)     Head Circumference --      Peak Flow --      Pain Score 12/17/23 1431 6     Pain Loc --      Pain Education --      Exclude from Growth Chart --     Most recent vital signs: Vitals:   12/17/23 1431 12/17/23 1433  BP: 136/72   Pulse:  (!) 118  Resp:  19  Temp:    SpO2:  95%    General Awake, no distress. NAD HEENT NCAT. PERRL. EOMI. No rhinorrhea. Mucous  membranes are moist.  CV:  Good peripheral perfusion. RRR RESP:  Normal effort. CTA ABD:  No distention.  MSK:  LUE with some subtle soft tissue swelling to the elbow.  Patient with some erythema noted to the dorsal lateral proximal forearm and chest proximal to the elbow.  Skin is warm to touch but no purulent drainage is appreciated..  The elbow shows focal scabbed lesion but no drainable abscess or fluctuance is appreciated.  Normal composite fist distally.   ED Results / Procedures / Treatments   Labs (all labs ordered are listed, but only abnormal results are displayed) Labs Reviewed  COMPREHENSIVE METABOLIC PANEL WITH GFR - Abnormal; Notable for the following components:      Result Value   Glucose, Bld 108 (*)    Total Protein 6.3 (*)    All other components within normal limits  CBC WITH DIFFERENTIAL/PLATELET - Abnormal; Notable for the following components:   RBC 4.18 (*)    Hemoglobin 12.1 (*)    HCT 36.8 (*)  Eosinophils Absolute 0.7 (*)    All other components within normal limits  URINALYSIS, W/ REFLEX TO CULTURE (INFECTION SUSPECTED) - Abnormal; Notable for the following components:   Color, Urine YELLOW (*)    APPearance CLEAR (*)    All other components within normal limits  CULTURE, BLOOD (ROUTINE X 2)  LACTIC ACID, PLASMA  PROTIME-INR    EKG   RADIOLOGY  I personally viewed and evaluated these images as part of my medical decision making, as well as reviewing the written report by the radiologist.  ED Provider Interpretation: Soft tissue swelling without evidence of bony abnormality  DG Elbow Complete Left Result Date: 12/17/2023 CLINICAL DATA:  Swelling with heat and redness. Unable to straighten the left elbow. EXAM: LEFT ELBOW - COMPLETE 3+ VIEW COMPARISON:  03/22/2015 FINDINGS: Nonstandard positioning limits evaluation. There is no evidence of acute fracture or dislocation. No focal bone lesion or bone destruction. Joint spaces are normal. No  significant effusion. There is evidence of diffuse soft tissue swelling with induration of the subcutaneous fat likely indicating edema or cellulitis. No soft tissue gas or radiopaque soft tissue foreign bodies. IMPRESSION: Diffuse soft tissue swelling.  No acute bony abnormalities. Electronically Signed   By: Elsie Gravely M.D.   On: 12/17/2023 15:36     PROCEDURES:  Critical Care performed: No  Procedures   MEDICATIONS ORDERED IN ED: Medications  doxycycline (VIBRA-TABS) tablet 100 mg (100 mg Oral Given 12/17/23 1549)  cephALEXin (KEFLEX) capsule 500 mg (500 mg Oral Given 12/17/23 1549)     IMPRESSION / MDM / ASSESSMENT AND PLAN / ED COURSE  I reviewed the triage vital signs and the nursing notes.                              Differential diagnosis includes, but is not limited to, contact dermatitis, cellulitis, abscess, bursitis  Patient's presentation is most consistent with acute complicated illness / injury requiring diagnostic workup.  Patient's diagnosis is consistent with local cellulitis to the elbow of the left upper extremity.  Patient presents after he manipulated a small bump on the lateral elbow, noted onset of redness and swelling.  He is afebrile on presentation denies any purulent drainage or discharge from the elbow.  He denies any prior complaints of fevers or chills.  We discussed treatment options including an initial, empiric IV dose of antibiotics, but the patient declined at this time.  His lab workup is overall reassuring he is afebrile on presentation.  No leukocytosis or critical anemia appreciated.  Lactic acid is normal and PT/INR within limits.  No concerning presentation for an acute infectious joint or infectious bursitis.  Symptoms likely represent a nonpurulent cellulitis.  Given the fact that the patient has manipulated previous lesion on the elbow, will cover for MRSA and MSSA, respectively.  Patient will be discharged home with prescriptions for  doxycycline and Keflex. Patient is to follow up with his PCP as suggested, as needed or otherwise directed. Patient is given ED precautions to return to the ED for any worsening or new symptoms.  FINAL CLINICAL IMPRESSION(S) / ED DIAGNOSES   Final diagnoses:  Cellulitis of left upper extremity     Rx / DC Orders   ED Discharge Orders          Ordered    cephALEXin (KEFLEX) 500 MG capsule  4 times daily        12/17/23 1543  doxycycline (VIBRA-TABS) 100 MG tablet  2 times daily        12/17/23 1543             Note:  This document was prepared using Dragon voice recognition software and may include unintentional dictation errors.    Loyd Candida LULLA Aldona, PA-C 12/17/23 2224    Waymond Lorelle Cummins, MD 12/17/23 2230

## 2023-12-17 NOTE — Discharge Instructions (Addendum)
 Your exam, labs, and x-ray are normal and reassuring at this time.  No signs of any bony injury.  No evidence of any drainable abscess on exam.  Will be treating you for a cellulitis to cover you for MRSA and MSSA.  Keep the elbow wound covered as necessary.  Avoid any manipulation.  Apply warm compress to help promote healing.  Return to the ED for any signs of worsening or spreading infection.

## 2023-12-22 LAB — CULTURE, BLOOD (ROUTINE X 2): Culture: NO GROWTH
